# Patient Record
Sex: Female | Born: 1995 | Race: Black or African American | Hispanic: No | Marital: Single | Smoking: Current every day smoker
Health system: Southern US, Community
[De-identification: ages and names within clinical notes are randomized; demographics above are authoritative.]

## PROBLEM LIST (undated history)

## (undated) DIAGNOSIS — L309 Dermatitis, unspecified: Secondary | ICD-10-CM

## (undated) DIAGNOSIS — L509 Urticaria, unspecified: Secondary | ICD-10-CM

## (undated) DIAGNOSIS — J45909 Unspecified asthma, uncomplicated: Secondary | ICD-10-CM

## (undated) HISTORY — DX: Unspecified asthma, uncomplicated: J45.909

## (undated) HISTORY — DX: Urticaria, unspecified: L50.9

## (undated) HISTORY — DX: Dermatitis, unspecified: L30.9

---
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
## 2019-06-28 ENCOUNTER — Emergency Department (HOSPITAL_COMMUNITY)
Admission: EM | Admit: 2019-06-28 | Discharge: 2019-06-29 | Disposition: A | Discharge status: Home / Self Care | Payer: BC Managed Care – PPO | Attending: Emergency Medicine | Admitting: Emergency Medicine

## 2019-06-28 ENCOUNTER — Other Ambulatory Visit: Payer: Self-pay

## 2019-06-28 ENCOUNTER — Emergency Department (HOSPITAL_COMMUNITY): Payer: BC Managed Care – PPO

## 2019-06-28 ENCOUNTER — Encounter (HOSPITAL_COMMUNITY): Payer: Self-pay

## 2019-06-28 DIAGNOSIS — Z20828 Contact with and (suspected) exposure to other viral communicable diseases: Secondary | ICD-10-CM | POA: Insufficient documentation

## 2019-06-28 DIAGNOSIS — R0981 Nasal congestion: Secondary | ICD-10-CM | POA: Insufficient documentation

## 2019-06-28 DIAGNOSIS — M791 Myalgia, unspecified site: Secondary | ICD-10-CM | POA: Diagnosis not present

## 2019-06-28 DIAGNOSIS — R509 Fever, unspecified: Secondary | ICD-10-CM | POA: Diagnosis not present

## 2019-06-28 DIAGNOSIS — R05 Cough: Secondary | ICD-10-CM | POA: Insufficient documentation

## 2019-06-28 DIAGNOSIS — J029 Acute pharyngitis, unspecified: Secondary | ICD-10-CM | POA: Diagnosis not present

## 2019-06-28 DIAGNOSIS — R45851 Suicidal ideations: Secondary | ICD-10-CM | POA: Diagnosis not present

## 2019-06-28 DIAGNOSIS — Z046 Encounter for general psychiatric examination, requested by authority: Secondary | ICD-10-CM | POA: Diagnosis not present

## 2019-06-28 DIAGNOSIS — F17210 Nicotine dependence, cigarettes, uncomplicated: Secondary | ICD-10-CM | POA: Diagnosis not present

## 2019-06-28 DIAGNOSIS — R0982 Postnasal drip: Secondary | ICD-10-CM | POA: Diagnosis not present

## 2019-06-28 DIAGNOSIS — F329 Major depressive disorder, single episode, unspecified: Secondary | ICD-10-CM | POA: Insufficient documentation

## 2019-06-28 DIAGNOSIS — F32A Depression, unspecified: Secondary | ICD-10-CM

## 2019-06-28 LAB — COMPREHENSIVE METABOLIC PANEL
ALT: 14 U/L (ref 0–44)
AST: 17 U/L (ref 15–41)
Albumin: 3.7 g/dL (ref 3.5–5.0)
Alkaline Phosphatase: 54 U/L (ref 38–126)
Anion gap: 8 (ref 5–15)
BUN: 16 mg/dL (ref 6–20)
CO2: 21 mmol/L — ABNORMAL LOW (ref 22–32)
Calcium: 8.8 mg/dL — ABNORMAL LOW (ref 8.9–10.3)
Chloride: 107 mmol/L (ref 98–111)
Creatinine, Ser: 0.97 mg/dL (ref 0.44–1.00)
GFR calc Af Amer: >60 mL/min (ref >60)
GFR calc non Af Amer: >60 mL/min (ref >60)
Glucose, Bld: 140 mg/dL — ABNORMAL HIGH (ref 70–99)
Potassium: 4 mmol/L (ref 3.5–5.1)
Sodium: 136 mmol/L (ref 135–145)
Total Bilirubin: 0.5 mg/dL (ref 0.3–1.2)
Total Protein: 6.8 g/dL (ref 6.5–8.1)

## 2019-06-28 LAB — I-STAT BETA HCG BLOOD, ED (MC, WL, AP ONLY): I-stat hCG, quantitative: <5 m[IU]/mL (ref <5)

## 2019-06-28 LAB — RAPID URINE DRUG SCREEN, HOSP PERFORMED
Amphetamines: NOT DETECTED
Barbiturates: NOT DETECTED
Benzodiazepines: NOT DETECTED
Cocaine: NOT DETECTED
Opiates: NOT DETECTED
Tetrahydrocannabinol: POSITIVE — AB

## 2019-06-28 LAB — CBC
HCT: 40 % (ref 36.0–46.0)
Hemoglobin: 12.6 g/dL (ref 12.0–15.0)
MCH: 22.2 pg — ABNORMAL LOW (ref 26.0–34.0)
MCHC: 31.5 g/dL (ref 30.0–36.0)
MCV: 70.5 fL — ABNORMAL LOW (ref 80.0–100.0)
Platelets: 262 10*3/uL (ref 150–400)
RBC: 5.67 MIL/uL — ABNORMAL HIGH (ref 3.87–5.11)
RDW: 14 % (ref 11.5–15.5)
WBC: 6.7 10*3/uL (ref 4.0–10.5)
nRBC: 0 % (ref 0.0–0.2)

## 2019-06-28 LAB — SALICYLATE LEVEL: Salicylate Lvl: <7 mg/dL (ref 2.8–30.0)

## 2019-06-28 LAB — ACETAMINOPHEN LEVEL: Acetaminophen (Tylenol), Serum: <10 ug/mL — ABNORMAL LOW (ref 10–30)

## 2019-06-28 LAB — SARS CORONAVIRUS 2 BY RT PCR (HOSPITAL ORDER, PERFORMED IN ~~LOC~~ HOSPITAL LAB): SARS Coronavirus 2: NEGATIVE

## 2019-06-28 LAB — ETHANOL: Alcohol, Ethyl (B): <10 mg/dL (ref <10)

## 2019-06-28 NOTE — ED Notes (Signed)
Personal belongings inventoried and stored at locker #1 at [facility] , pt. wearing purple scrubs , sitter ordered .

## 2019-06-28 NOTE — ED Provider Notes (Signed)
[facility] Provider Note   CSN: [id number] Arrival date & time: 06/28/19  1942     History   Chief Complaint Chief Complaint  Patient presents with   Fever   Psychiatric Evaluation    HPI Brenda Alvarez is a 23 y.o. female presenting for evaluation of generalized body aches, tiredness, cough, and SI.  Patient states the past few days, she has been having worsening URI symptoms.  She states it began with nasal congestion and sinus pressure.  She then developed postnasal drip and sore throat.  She has been coughing, is nonproductive.  She reports feeling hot and cold, but no documented fevers.  She reports generalized body aches and feeling very tired.  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  She states that been having a lot of social issues, which is been causing her depression to be worse.  Her poor physical health recently has made her mental health even worse.  She reports she is having thoughts about wanting to herself, but does not have a plan.  She is not attempted to hurt herself recently.  She states that she used to be on medication for behavioral health, but is been many years.  She takes no medications currently.  She has no other medical problems.  She smokes 1 pack of cigarettes every 2 weeks, drinks about twice a week, and reports daily reduce.  No other drug use.  She denies chest pain, shortness of breath, nausea, vomiting, abdominal pain, urinary symptoms, abnormal bowel movements.     HPI  History reviewed. No pertinent past medical history.  There are no active problems to display for this patient.     OB History   No obstetric history on file.      Home Medications    Prior to Admission medications   Not on File    Family History History reviewed. No pertinent family history.  Social History Social History   Tobacco Use   Smoking status: Not on  file  Substance Use Topics   Alcohol use: Not on file   Drug use: Not on file     Allergies   Patient has no known allergies.   Review of Systems Review of Systems  Constitutional: Positive for fever (subjective).  HENT: Positive for congestion, postnasal drip, sinus pressure and sore throat.   Respiratory: Positive for cough.   Psychiatric/Behavioral: Positive for suicidal ideas.  All other systems reviewed and are negative.    Physical Exam Updated Vital Signs BP (!) 152/76 (BP Location: Right Arm)    Pulse 79    Temp 99.3 F (37.4 C) (Oral)    Resp 16    Ht 5\' 8"  (1.727 m)    Wt 106.6 kg    SpO2 99%    BMI 35.73 kg/m   Physical Exam Vitals signs and nursing note reviewed.  Constitutional:      General: She is not in acute distress.    Appearance: She is well-developed.     Comments: Appears nontoxic  HENT:     Head: Normocephalic and atraumatic.     Comments: OP without tonsillar swelling or erythema.  Uvula midline with equal palate rise.  Nasal congestion/rhinorrhea noted.    Nose: Mucosal edema and congestion present.  Eyes:     Extraocular Movements: Extraocular movements intact.     Conjunctiva/sclera: Conjunctivae normal.     Pupils: Pupils  are equal, round, and reactive to light.  Neck:     Musculoskeletal: Normal range of motion and neck supple.  Cardiovascular:     Rate and Rhythm: Normal rate and regular rhythm.     Pulses: Normal pulses.  Pulmonary:     Effort: Pulmonary effort is normal. No respiratory distress.     Breath sounds: Normal breath sounds. No wheezing.     Comments: Speaking in full sentences.  Clear lung sounds in all fields.  No signs of respiratory distress. Abdominal:     General: There is no distension.     Palpations: Abdomen is soft. There is no mass.     Tenderness: There is no abdominal tenderness. There is no guarding or rebound.  Musculoskeletal: Normal range of motion.  Skin:    General: Skin is warm and dry.    Neurological:     Mental Status: She is alert and oriented to person, place, and time.  Psychiatric:        Attention and Perception: She does not perceive auditory hallucinations.        Thought Content: Thought content includes suicidal ideation. Thought content does not include homicidal ideation. Thought content does not include homicidal or suicidal plan.     Comments: Very flat affect and withdrawn. Reports increasing depression and SI without plan. Denies HI or AVH      ED Treatments / Results  Labs (all labs ordered are listed, but only abnormal results are displayed) Labs Reviewed  COMPREHENSIVE METABOLIC PANEL - Abnormal; Notable for the following components:      Result Value   CO2 21 (*)    Glucose, Bld 140 (*)    Calcium 8.8 (*)    All other components within normal limits  ACETAMINOPHEN LEVEL - Abnormal; Notable for the following components:   Acetaminophen (Tylenol), Serum <10 (*)    All other components within normal limits  CBC - Abnormal; Notable for the following components:   RBC 5.67 (*)    MCV 70.5 (*)    MCH 22.2 (*)    All other components within normal limits  RAPID URINE DRUG SCREEN, HOSP PERFORMED - Abnormal; Notable for the following components:   Tetrahydrocannabinol POSITIVE (*)    All other components within normal limits  SARS CORONAVIRUS 2 (HOSPITAL ORDER, PERFORMED IN [facility])  ETHANOL  SALICYLATE LEVEL  I-STAT BETA HCG BLOOD, ED ([facility], [facility], [facility] ONLY)    EKG None  Radiology Dg Chest Portable 1 View  Result Date: 06/28/2019 CLINICAL DATA:  Cough. EXAM: PORTABLE CHEST 1 VIEW COMPARISON:  None. FINDINGS: The heart size and mediastinal contours are within normal limits. Both lungs are clear. The visualized skeletal structures are unremarkable. IMPRESSION: No active disease. Electronically Signed   By: Katherine Mantlehristopher  Green M.D.   On: 06/28/2019 21:07    Procedures Procedures (including critical care time)  Medications Ordered  in ED Medications - No data to display   Initial Impression / Assessment and Plan / ED Course  I have reviewed the triage vital signs and the nursing notes.  Pertinent labs & imaging results that were available during my care of the patient were reviewed by me and considered in my medical decision making (see chart for details).        Patient presenting for evaluation of viral-like illness and SI.  Physical exam shows patient is afebrile and appears nontoxic.  However, she may have had a coronavirus exposure, and her symptoms could be due  to this.  Also consider other viral illnesses worse sinusitis.  Will obtain labs, chest x-ray, and covid swab.  Patient also reporting SI without plan, although worsening depression.  Will have patient evaluated by tts.  X-ray viewed interpreted by me, no pneumonia, pneumothorax, effusion, cardiomegaly.  Labs overall reassuring.  COVID pending.  Covid negative.  As such, symptoms are likely due to viral illness, and I recommend symptomatic treatment. Pt is medically cleared for tts eval.   Lenetta QuakerJasmine M Finchum was evaluated in Emergency Department on 06/28/2019 for the symptoms described in the history of present illness. She was evaluated in the context of the global COVID-19 pandemic, which necessitated consideration that the patient might be at risk for infection with the SARS-CoV-2 virus that causes COVID-19. Institutional protocols and algorithms that pertain to the evaluation of patients at risk for COVID-19 are in a state of rapid change based on information released by regulatory bodies including the CDC and federal and state organizations. These policies and algorithms were followed during the patient's care in the ED.   Final Clinical Impressions(s) / ED Diagnoses   Final diagnoses:  None    ED Discharge Orders    None       Alveria ApleyCaccavale, Bellami Farrelly, PA-C 06/29/19 0030    Tilden Fossaees, Elizabeth, MD 06/29/19 1155

## 2019-06-28 NOTE — ED Triage Notes (Signed)
Pt arrives POV for eval of known covid +exposure, subjective fever, fatigue and general malaise. During triage pt also reports passive SI w/ no plan, however does state she no longer wishes to live. Pt is calm, cooperative during triage.

## 2019-06-29 DIAGNOSIS — F329 Major depressive disorder, single episode, unspecified: Secondary | ICD-10-CM | POA: Diagnosis present
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
## 2019-06-29 NOTE — ED Notes (Signed)
Pt upset this morning cursing wanting to go home not understanding why she is here , explained why she is here and the next steps for her she verbalized understanding of plan , sitter remains at beside

## 2019-06-29 NOTE — ED Notes (Signed)
Pt walking out. Pt states she is leaving. This RN encouraged patient to stay.

## 2019-06-29 NOTE — ED Notes (Signed)
Patient slammed the door after nurse explained to her that she can not leave / explained the plan of care ( reevaluate in am ) .

## 2019-06-29 NOTE — ED Notes (Signed)
TTS video interview in progress .  

## 2019-06-29 NOTE — Discharge Instructions (Signed)
Follow-up as recommended by behavioral health.  Return to ER for new or worsening symptoms.

## 2019-06-29 NOTE — ED Notes (Signed)
Pt slamming door. RN to the bedside.

## 2019-06-29 NOTE — ED Notes (Signed)
TTS video equipment set up at bedside for counselor interview.

## 2019-06-29 NOTE — ED Notes (Signed)
Nurse explained plan of care to pt.

## 2019-06-29 NOTE — ED Provider Notes (Signed)
23 year old female patient seen in behavioral health rounds today.  Patient is alert, ambulatory without any assistance, denies any complaints or concerns at this time.  Patient was seen by behavioral health who recommends follow-up with her outpatient provider.  Recommend patient return to ER for new or worsening symptoms.   Tacy Learn, PA-C 06/29/19 1423    Charlesetta Shanks, MD 07/04/19 [phone]

## 2019-06-29 NOTE — ED Notes (Signed)
Pt is resting calmly and being cooperative. Pt aware that we are waiting for dispo. Pt also made aware that mother has called she inform myself that she would not like me to talk to her.

## 2019-06-29 NOTE — ED Notes (Signed)
Counsellor advised RN that pt.wiill stay this evening and will be reevaluated in the morning.

## 2019-06-29 NOTE — ED Notes (Signed)
[facility] called to inquire about note for dispo- Did speak to the NP and was told they were working on this now with a plan to discharge. Will inform EDP once note is in.

## 2019-06-29 NOTE — BH Assessment (Addendum)
Tele Assessment Note   Patient Name: Brenda Alvarez MRN: [id number] Referring Physician: Dr. Tilden FossaElizabeth Rees, MD [facility] Location of Provider: Behavioral Health TTS Department  Leavy CellaJasmine Kaleen OdeaM Yankey is a 23 y.o. female [facility] to experiencing symptoms that she is attributing possible COVID 19 exposure. Pt shares she works in a job where she has constant interactions with people (two different [employer] stations) and that she's not allowed to wear a mask at either one, so when she began feeling ill she was not surprised to learn that one of her co-workers had become ill. Pt states that, on top of these symptoms, she has also been experiencing SI. Pt shares this SI is passive and that she has no plan. Pt states she has a hx of "may" attempts with the most recent 2 months ago. [facility].  Pt denies HI, engagement in the legal system, or access to weapons/guns. Pt deferred answering the question about NSSIB. She shared she sometimes sees "shadows" or hears "things." she shares she smokes marijuana approximately 1x/week.  Pt declined having anyone clinician could talk to for collateral.   Pt is oriented x4. Her recent and remote memory is intact. Pt was cooperative, though cagey at times, during the assessment process. Pt's insight, judgement, and impulse control is impaired at this time.   Diagnosis: F33.2, Major depressive disorder, Recurrent episode, Severe   Past Medical History: History reviewed. No pertinent past medical history.   Family History: History reviewed. No pertinent family history.  Social History:  has no history on file for tobacco, alcohol, and drug.  Additional Social History:  Alcohol / Drug Use Pain Medications: Please see MAR Prescriptions: Please see MAR Over the Counter: Please see MAR History of alcohol / drug use?: Yes Longest period of sobriety (when/how long): Unknown Substance #1 Name of Substance 1:  Marijuana 1 - Age of First Use: 15 1 - Amount (size/oz): 1/4 gram 1 - Frequency: 1x/week 1 - Duration: Unknown 1 - Last Use / Amount: 06/27/2019  CIWA: CIWA-Ar BP: 122/74 Pulse Rate: 62 COWS:    Allergies: No Known Allergies  Home Medications: (Not in a hospital admission)   OB/GYN Status:  No LMP recorded.  General Assessment Data [facility] Assessment: In system Is this a Tele or Face-to-Face Assessment?: Tele Assessment Is this an Initial Assessment or a Re-assessment for this encounter?: Initial Assessment Patient Accompanied by:: N/A Language Other than English: No Living Arrangements: Other (Comment)(Pt is currently living with her father) What gender do you identify as?: Female Marital status: Single Maiden name: Reola CalkinsBeck Pregnancy Status: No Living Arrangements: Parent Can pt return to current living arrangement?: Yes Admission Status: Voluntary Is patient capable of signing voluntary admission?: Yes Referral Source: Self/Family/Friend Insurance type: BCBS     Crisis Care Plan Living Arrangements: Parent Legal Guardian: Other:(Self) Name of Psychiatrist: None Name of Therapist: None  Education Status Is patient currently in school?: No Is the patient employed, unemployed or receiving disability?: Employed  Risk to self with the past 6 months Suicidal Ideation: Yes-Currently Present Has patient been a risk to self within the past 6 months prior to admission? : No Suicidal Intent: No Has patient had any suicidal intent within the past 6 months prior to admission? : No Is patient at risk for suicide?: Yes Suicidal Plan?: No Has patient had any suicidal plan within the past 6 months prior to admission? :  No Access to Means: No What has been your use of drugs/alcohol within the last 12 months?: Pt acknowledges marijuana use Previous Attempts/Gestures: Yes("Many") How many times?: ("Many") Other Self Harm Risks: None noted Triggers  for Past Attempts: Unpredictable, Unknown Intentional Self Injurious Behavior: (Pt deferred this answer) Family Suicide History: Unknown Recent stressful life event(s): Conflict (Comment), Loss (Comment)(Broke up w/ partner, moved multiple x, conflict w/ others) Persecutory voices/beliefs?: No Depression: Yes Depression Symptoms: Insomnia, Isolating, Guilt, Feeling worthless/self pity, Loss of interest in usual pleasures Substance abuse history and/or treatment for substance abuse?: No Suicide prevention information given to non-admitted patients: Not applicable  Risk to Others within the past 6 months Homicidal Ideation: No Does patient have any lifetime risk of violence toward others beyond the six months prior to admission? : No Thoughts of Harm to Others: No Current Homicidal Intent: No Current Homicidal Plan: No Access to Homicidal Means: No Identified Victim: None noted History of harm to others?: No Assessment of Violence: On admission Violent Behavior Description: None noted Does patient have access to weapons?: (Pt denied access to guns/weapons) Criminal Charges Pending?: No Does patient have a court date: No Is patient on probation?: No  Psychosis Hallucinations: Visual, Auditory Delusions: None noted  Mental Status Report Appearance/Hygiene: In scrubs Eye Contact: Good Motor Activity: Unremarkable Speech: Logical/coherent Level of Consciousness: Alert Mood: Depressed, Anxious Affect: Appropriate to circumstance Anxiety Level: Minimal Thought Processes: Circumstantial Judgement: Impaired Orientation: Person, Place, Time, Situation Obsessive Compulsive Thoughts/Behaviors: None  Cognitive Functioning Concentration: Normal Memory: Recent Intact, Remote Intact Is patient IDD: No Insight: Fair Impulse Control: Poor Appetite: Fair Have you had any weight changes? : No Change Sleep: No Change Total Hours of Sleep: 5 Vegetative Symptoms: None  ADLScreening  [facility] Assessment Services) Patient's cognitive ability adequate to safely complete daily activities?: Yes Patient able to express need for assistance with ADLs?: Yes Independently performs ADLs?: Yes (appropriate for developmental age)  Prior Inpatient Therapy Prior Inpatient Therapy: Yes Prior Therapy Dates: 2009 Prior Therapy Facilty/Provider(s): [facility] Reason for Treatment: SI  Prior Outpatient Therapy Prior Outpatient Therapy: No Does patient have an ACCT team?: No Does patient have Intensive In-House Services?  : No Does patient have [facility] services? : No Does patient have P4CC services?: No  ADL Screening (condition at time of admission) Patient's cognitive ability adequate to safely complete daily activities?: Yes Is the patient deaf or have difficulty hearing?: No Does the patient have difficulty seeing, even when wearing glasses/contacts?: No Does the patient have difficulty concentrating, remembering, or making decisions?: No Patient able to express need for assistance with ADLs?: Yes Does the patient have difficulty dressing or bathing?: No Independently performs ADLs?: Yes (appropriate for developmental age) Does the patient have difficulty walking or climbing stairs?: No Weakness of Legs: None Weakness of Arms/Hands: None  Home Assistive Devices/Equipment Home Assistive Devices/Equipment: None  Therapy Consults (therapy consults require a physician order) PT Evaluation Needed: No OT Evalulation Needed: No SLP Evaluation Needed: No Abuse/Neglect Assessment (Assessment to be complete while patient is alone) Abuse/Neglect Assessment Can Be Completed: Unable to assess, patient is non-responsive or altered mental status Values / Beliefs Cultural Requests During Hospitalization: None Spiritual Requests During Hospitalization: None Consults Spiritual Care Consult Needed: No Social Work Consult Needed: No Regulatory affairs officer (For Healthcare) Does Patient Have  a Medical Advance Directive?: No Would patient like information on creating a medical advance directive?: No - Patient declined        Disposition: Lindon Romp, NP, reviewed  pt's chart and information and determined pt should be observed overnight for safety and stabilization. Pt will be re-assessed in the morning (06/29/2019). This information was provided to pt's nurse, Lucious Grovesobert RN, at [phone].   Disposition Initial Assessment Completed for this Encounter: Yes Patient referred to: Other (Comment)(Pt will be observed overnight for safety and stability)  This service was provided via telemedicine using a 2-way, interactive audio and video technology.  Names of all persons participating in this telemedicine service and their role in this encounter. Name: Leavy CellaJasmine 'Ember' Arrey Role: Patient  Name: Nira ConnJason Berry Role: Nurse Practitioner  Name: Duard BradySamantha Elliett Guarisco Role: Candise Cheliician    Armeda Plumb L Hala Narula 06/29/2019 3:09 AM

## 2019-08-02 DIAGNOSIS — M654 Radial styloid tenosynovitis [de Quervain]: Secondary | ICD-10-CM | POA: Diagnosis not present

## 2019-08-26 DIAGNOSIS — M654 Radial styloid tenosynovitis [de Quervain]: Secondary | ICD-10-CM | POA: Diagnosis not present

## 2019-10-12 DIAGNOSIS — Z20828 Contact with and (suspected) exposure to other viral communicable diseases: Secondary | ICD-10-CM | POA: Diagnosis not present

## 2019-10-12 DIAGNOSIS — R509 Fever, unspecified: Secondary | ICD-10-CM | POA: Diagnosis not present

## 2020-03-15 DIAGNOSIS — Z20822 Contact with and (suspected) exposure to covid-19: Secondary | ICD-10-CM | POA: Diagnosis not present

## 2020-03-15 DIAGNOSIS — R509 Fever, unspecified: Secondary | ICD-10-CM | POA: Diagnosis not present

## 2020-03-15 DIAGNOSIS — J029 Acute pharyngitis, unspecified: Secondary | ICD-10-CM | POA: Diagnosis not present

## 2020-03-15 DIAGNOSIS — M791 Myalgia, unspecified site: Secondary | ICD-10-CM | POA: Diagnosis not present

## 2020-12-04 DIAGNOSIS — Z113 Encounter for screening for infections with a predominantly sexual mode of transmission: Secondary | ICD-10-CM | POA: Diagnosis not present

## 2020-12-04 DIAGNOSIS — Z114 Encounter for screening for human immunodeficiency virus [HIV]: Secondary | ICD-10-CM | POA: Diagnosis not present

## 2020-12-06 ENCOUNTER — Other Ambulatory Visit: Payer: Self-pay

## 2020-12-06 ENCOUNTER — Emergency Department (HOSPITAL_COMMUNITY): Payer: BC Managed Care – PPO

## 2020-12-06 ENCOUNTER — Emergency Department (HOSPITAL_COMMUNITY)
Admission: EM | Admit: 2020-12-06 | Discharge: 2020-12-07 | Disposition: A | Discharge status: Home / Self Care | Payer: BC Managed Care – PPO | Attending: Emergency Medicine | Admitting: Emergency Medicine

## 2020-12-06 DIAGNOSIS — X58XXXA Exposure to other specified factors, initial encounter: Secondary | ICD-10-CM | POA: Insufficient documentation

## 2020-12-06 DIAGNOSIS — T189XXA Foreign body of alimentary tract, part unspecified, initial encounter: Secondary | ICD-10-CM | POA: Diagnosis not present

## 2020-12-06 MED ORDER — LIDOCAINE VISCOUS HCL 2 % MT SOLN
15.0000 mL | Freq: Once | OROMUCOSAL | Status: AC
  Administered 2020-12-07: 15 mL via ORAL
  Filled 2020-12-06: qty 15

## 2020-12-06 MED ORDER — ALUM & MAG HYDROXIDE-SIMETH 200-200-20 MG/5ML PO SUSP
30.0000 mL | Freq: Once | ORAL | Status: AC
  Administered 2020-12-07: 30 mL via ORAL
  Filled 2020-12-06: qty 30

## 2020-12-06 NOTE — ED Triage Notes (Signed)
Patient reports she was drinking at a restaurant and swallowed glass. Patient reports she is unsure what size the glass was but her throat hurts on the left side and she tastes blood when she coughs and her voice is hoarse.

## 2020-12-06 NOTE — ED Provider Notes (Signed)
[facility]-EMERGENCY DEPT Provider Note   CSN: [id number] Arrival date & time: 12/06/20  2212     History Chief Complaint  Patient presents with   Swallowed Foreign Body    Brenda Alvarez is a 24 y.o. female with a history of depression who presents the emergency department with a chief complaint of foreign body ingestion.  The patient reports that she was drinking a margarita with salt on the rim just prior to arrival.  After taking approximately 3 sips of the drink, she felt a solid substance on her tongue.  She initially thought it was a piece of salt from the rim, but when she stuck out her tongue the object was concerning for a piece of glass.  She did not notice any abnormalities to the glass that she was drinking out of.  Following the episode, she did started to develop a left-sided sore throat that she characterizes as sharp and nonradiating.  She states that she felt that her voice was more hoarse and she had a cough where she noticed a small amount of bright red blood in the cough.  She states that prior to drinking a margarita that she was asymptomatic.   She denies any clots in the cough.  She did not have a choking episode while turning the [employer].  She has had no nausea, vomiting, hematemesis, abdominal pain, back pain, drooling, trismus, fever, chills, URI symptoms, rectal bleeding, melena, or hematochezia.  She is concerned that she may have swallowed some glass in the first couple of sips of her drink given that she had sudden onset of symptoms after drinking the beverage.  No history of similar.  No treatment prior to arrival.  No known aggravating or alleviating factors.  The history is provided by the patient and medical records. No language interpreter was used.       No past medical history on file.  Patient Active Problem List   Diagnosis Date Noted   MDD (major depressive disorder) 06/29/2019    OB History   No obstetric history  on file.     No family history on file.     Home Medications Prior to Admission medications   Medication Sig Start Date End Date Taking? Authorizing Provider  lidocaine (XYLOCAINE) 2 % solution Use as directed 15 mLs in the mouth or throat every 3 (three) hours as needed for mouth pain. 12/07/20   Ondrea Dow A, PA-C    Allergies    Celery oil, Duricef [cefadroxil], Keflex [cephalexin], Lactose intolerance (gi), and Pork-derived products  Review of Systems   Review of Systems  Constitutional: Negative for activity change, chills and fever.  HENT: Positive for sore throat. Negative for congestion, sinus pressure, sinus pain, trouble swallowing and voice change.   Respiratory: Positive for cough. Negative for choking, shortness of breath and wheezing.   Cardiovascular: Negative for chest pain, palpitations and leg swelling.  Gastrointestinal: Negative for abdominal pain, diarrhea, nausea, rectal pain and vomiting.  Genitourinary: Negative for dysuria.  Musculoskeletal: Negative for back pain.  Skin: Negative for rash.  Allergic/Immunologic: Negative for immunocompromised state.  Neurological: Negative for headaches.  Psychiatric/Behavioral: Negative for confusion.    Physical Exam Updated Vital Signs BP 125/75    Pulse 64    Temp 98.6 F (37 C)    Resp 17    LMP 11/27/2020 Comment: neg preg test   SpO2 99%   Physical Exam Vitals and nursing note reviewed.  Constitutional:  General: She is not in acute distress.    Comments: Well-appearing.  No acute distress.  HENT:     Head: Normocephalic.     Mouth/Throat:     Mouth: Mucous membranes are moist.     Pharynx: No oropharyngeal exudate or posterior oropharyngeal erythema.     Comments: No wounds noted to the dorsum of the tongue.  Posterior oropharynx is unremarkable.  Tolerating secretions without difficulty.  Phonation is normal. Eyes:     Conjunctiva/sclera: Conjunctivae normal.  Cardiovascular:     Rate and  Rhythm: Normal rate and regular rhythm.     Pulses: Normal pulses.     Heart sounds: No murmur heard. No friction rub. No gallop.   Pulmonary:     Effort: Pulmonary effort is normal. No respiratory distress.     Breath sounds: No stridor. No wheezing, rhonchi or rales.  Chest:     Chest wall: No tenderness.  Abdominal:     General: There is no distension.     Palpations: Abdomen is soft. There is no mass.     Tenderness: There is no abdominal tenderness. There is no right CVA tenderness, left CVA tenderness, guarding or rebound.     Hernia: No hernia is present.     Comments: Abdomen is soft, nontender, nondistended.  Musculoskeletal:     Cervical back: Neck supple.  Skin:    General: Skin is warm.     Findings: No rash.  Neurological:     Mental Status: She is alert.  Psychiatric:        Behavior: Behavior normal.     ED Results / Procedures / Treatments   Labs (all labs ordered are listed, but only abnormal results are displayed) Labs Reviewed  POC URINE PREG, ED    EKG None  Radiology DG Chest 2 View  Result Date: 12/06/2020 CLINICAL DATA:  Swallowed piece of glass EXAM: CHEST - 2 VIEW COMPARISON:  06/28/2019 FINDINGS: Heart and mediastinal contours are within normal limits. No focal opacities or effusions. No acute bony abnormality. No radiopaque foreign body. IMPRESSION: Negative. Electronically Signed   By: Charlett Nose M.D.   On: 12/06/2020 23:47   DG Abdomen 1 View  Result Date: 12/07/2020 CLINICAL DATA:  Foreign body ingestion EXAM: ABDOMEN - 1 VIEW COMPARISON:  None. FINDINGS: Normal abdominal gas pattern. No gross free intraperitoneal gas. No retained radiopaque foreign body within the abdomen. No organomegaly. No abnormal calcifications within the abdomen. No acute bone abnormality. IMPRESSION: Negative.  No retained radiopaque foreign body within the abdomen. Electronically Signed   By: Helyn Numbers MD   On: 12/07/2020 02:17    Procedures Procedures  (including critical care time)  Medications Ordered in ED Medications  alum & mag hydroxide-simeth (MAALOX/MYLANTA) 200-200-20 MG/5ML suspension 30 mL (30 mLs Oral Given 12/07/20 0051)    And  lidocaine (XYLOCAINE) 2 % viscous mouth solution 15 mL (15 mLs Oral Given 12/07/20 0051)    ED Course  I have reviewed the triage vital signs and the nursing notes.  Pertinent labs & imaging results that were available during my care of the patient were reviewed by me and considered in my medical decision making (see chart for details).    MDM Rules/Calculators/A&P                          24 year old female with a history of depression who presents to the emergency department with concern for foreign body ingestion.  She is concerned that she swallowed a small piece of glass while consuming a margarita with a salt run just prior to arrival.  She states that she did notice one small piece of glass on her tongue that she spit out, but notably did not have any wounds or lacerations from the tongue from the episode.  She is concerned that on the first 3 drinks of her beverage that she may have consumed a piece of glass as she began having a sore throat, hoarse voice, and had a brief episode of coughing where she noticed a small amount of bright red blood.   Vital signs are stable.  The patient was discussed with Dr. Elesa Massed, attending physician.  Posterior oropharynx is unremarkable.  Dorsum of the tongue is unremarkable.  I do not see any wounds present on the oropharynx.  Abdomen is benign.  Imaging has been independently reviewed and interpreted by me.  X-ray of the chest and abdomen were obtained.  No evidence of foreign body.  No evidence of perforation of the abdomen.  No acute findings.  She was given a a GI cocktail in the ER and reports significant improvement in her sore throat.  Could consider a viral pharyngitis or esophageal irritation secondary to the beverage that she was consuming.  Have a  low suspicion for esophageal impaction or abdominal perforation.  Recommended close observation for her symptoms.  She will be discharged with viscous lidocaine to help with her sore throat.  She was advised that she should return to the emergency department if she develops sudden onset CeraVe or abdominal pain, rectal bleeding, melena, hematochezia, hematemesis, as well as other strict return precautions.  She is hemodynamically stable and in no acute distress.  Safe discharge home with outpatient follow-up as needed.    Final Clinical Impression(s) / ED Diagnoses Final diagnoses:  Swallowed foreign body, initial encounter    Rx / DC Orders ED Discharge Orders         Ordered    lidocaine (XYLOCAINE) 2 % solution  Every  3 hours PRN        12/07/20 0248           Frederik Pear A, PA-C 12/07/20 0336    Ward, Layla Maw, DO 12/07/20 [phone]

## 2020-12-07 ENCOUNTER — Emergency Department (HOSPITAL_COMMUNITY): Payer: BC Managed Care – PPO

## 2020-12-07 DIAGNOSIS — T189XXA Foreign body of alimentary tract, part unspecified, initial encounter: Secondary | ICD-10-CM | POA: Diagnosis not present

## 2020-12-07 LAB — POC URINE PREG, ED: Preg Test, Ur: NEGATIVE

## 2020-12-07 MED ORDER — LIDOCAINE VISCOUS HCL 2 % MT SOLN: 15.0000 mL | OROMUCOSAL | 0 refills | Status: AC | PRN

## 2020-12-07 NOTE — Discharge Instructions (Signed)
Thank you for allowing me to care for you today in the Emergency Department.   Your work-up today was reassuring.  You will likely passed the small piece of glass over the next 24 to 48 hours in your stool without difficulty.  You can swallow 15 mL of viscous lidocaine once every 3 hours as needed for sore throat.  However, if you start having severe, uncontrollable abdominal pain, bloody or black stool, uncontrollable vomiting, vomiting blood, or other new concerning symptoms, you should return to the emergency department for re-evaluation.

## 2021-01-03 ENCOUNTER — Other Ambulatory Visit: Payer: Self-pay

## 2021-01-03 ENCOUNTER — Encounter (HOSPITAL_COMMUNITY): Payer: Self-pay

## 2021-01-03 ENCOUNTER — Emergency Department (HOSPITAL_COMMUNITY)
Admission: EM | Admit: 2021-01-03 | Discharge: 2021-01-03 | Disposition: A | Discharge status: Home / Self Care | Payer: BC Managed Care – PPO | Attending: Emergency Medicine | Admitting: Emergency Medicine

## 2021-01-03 DIAGNOSIS — R220 Localized swelling, mass and lump, head: Secondary | ICD-10-CM | POA: Diagnosis not present

## 2021-01-03 DIAGNOSIS — M795 Residual foreign body in soft tissue: Secondary | ICD-10-CM

## 2021-01-03 DIAGNOSIS — S0085XA Superficial foreign body of other part of head, initial encounter: Secondary | ICD-10-CM | POA: Diagnosis not present

## 2021-01-03 MED ORDER — CLINDAMYCIN HCL 150 MG PO CAPS: 450.0000 mg | ORAL_CAPSULE | Freq: Three times a day (TID) | ORAL | 0 refills | Status: AC

## 2021-01-03 MED ORDER — LIDOCAINE-EPINEPHRINE (PF) 2 %-1:200000 IJ SOLN
10.0000 mL | Freq: Once | INTRAMUSCULAR | Status: DC
  Filled 2021-01-03: qty 20

## 2021-01-03 NOTE — ED Provider Notes (Signed)
[facility]-EMERGENCY DEPT Provider Note   CSN: [id number] Arrival date & time: 01/03/21  0932     History Chief Complaint  Patient presents with  . Facial Swelling    Brenda Alvarez is a 25 y.o. female with no medical history presents the ED with complaints of lip swelling in context of recent lip piercing.  Patient reports that 3 days ago she had multiple piercings to her lower lip.  She states that the ball on one of her piercings is now lodged inside of her lip and she cannot remove it.  Her lip is now swollen considerably.  She denies any fevers, difficulty eating or drinking, difficulty swallowing, shortness of breath, or other symptoms.  She has been taking 800 mg ibuprofen every 6 hours.  No significant improvement.  HPI     History reviewed. No pertinent past medical history.  Patient Active Problem List   Diagnosis Date Noted  . MDD (major depressive disorder) 06/29/2019    History reviewed. No pertinent surgical history.   OB History   No obstetric history on file.     History reviewed. No pertinent family history.     Home Medications Prior to Admission medications   Medication Sig Start Date End Date Taking? Authorizing Provider  clindamycin (CLEOCIN) 150 MG capsule Take 3 capsules (450 mg total) by mouth 3 (three) times daily for 5 days. 01/03/21 01/08/21 Yes Lorelee New, PA-C  lidocaine (XYLOCAINE) 2 % solution Use as directed 15 mLs in the mouth or throat every 3 (three) hours as needed for mouth pain. 12/07/20   McDonald, Mia A, PA-C    Allergies    Celery oil, Duricef [cefadroxil], Keflex [cephalexin], Lactose intolerance (gi), and Pork-derived products  Review of Systems   Review of Systems  All other systems reviewed and are negative.   Physical Exam Updated Vital Signs BP (!) 138/93 (BP Location: Left Arm)   Pulse 74   Temp 98.1 F (36.7 C) (Oral)   Resp 17   LMP 12/20/2020   SpO2 100%   Physical Exam Vitals  and nursing note reviewed. Exam conducted with a chaperone present.  Constitutional:      Appearance: Normal appearance.  HENT:     Head: Normocephalic and atraumatic.     Mouth/Throat:     Comments: Multiple piercings involving her lower lip.  Medial piercing with ball lodged in lower lip.  Can still be partially visualized (see photo).  There is also considerable swelling involving her lower lip on the affected side.  No trismus.  Patent oropharynx.  No drooling. Eyes:     General: No scleral icterus.    Conjunctiva/sclera: Conjunctivae normal.  Pulmonary:     Effort: Pulmonary effort is normal.  Skin:    General: Skin is dry.  Neurological:     Mental Status: She is alert.     GCS: GCS eye subscore is 4. GCS verbal subscore is 5. GCS motor subscore is 6.  Psychiatric:        Mood and Affect: Mood normal.        Behavior: Behavior normal.        Thought Content: Thought content normal.          ED Results / Procedures / Treatments   Labs (all labs ordered are listed, but only abnormal results are displayed) Labs Reviewed - No data to display  EKG None  Radiology No results found.  Procedures .Foreign Body Removal  Date/Time: 01/03/2021  3:39 PM Performed by: Lorelee New, PA-C Authorized by: Lorelee New, PA-C  Consent: Verbal consent obtained. Consent given by: patient Patient understanding: patient states understanding of the procedure being performed Patient consent: the patient's understanding of the procedure matches consent given Procedure consent: procedure consent matches procedure scheduled Relevant documents: relevant documents present and verified Patient identity confirmed: verbally with patient Body area: skin General location: head/neck Location details: mouth Anesthesia: local infiltration  Anesthesia: Local Anesthetic: lidocaine 1% with epinephrine Patient restrained: no Patient cooperative: yes Removal mechanism:  forceps Complexity: simple Post-procedure assessment: foreign body removed Comments: Removed piercing from lower lip.  Encouraged her to leave it out.  She denies any issues with other piercing and would prefer that it is not removed at this time.  I feel as though that is reasonable.     (including critical care time)  Medications Ordered in ED Medications  lidocaine-EPINEPHrine (XYLOCAINE W/EPI) 2 %-1:200000 (PF) injection 10 mL (has no administration in time range)    ED Course  I have reviewed the triage vital signs and the nursing notes.  Pertinent labs & imaging results that were available during my care of the patient were reviewed by me and considered in my medical decision making (see chart for details).    MDM Rules/Calculators/A&P                          Patient with multiple piercings involving her lower lip.  The one that was recently placed (medially) is now lodged in her lower lip which has caused considerable swelling and discomfort.  The area was anesthetized and the ball was unscrewed allowing for the entire piercing to be removed (see picture #2).    I instructed patient to leave the piercing out.  I also offered to remove her other piercing to mitigate risk of complication, but patient declined as she would prefer to keep it in place.  She denies any issues with that piercing.  I feel as though her swelling should subside now that the lodged piercing is removed.  Encouraged her to continue with ibuprofen 6 mg every 6 hours as needed for swelling and discomfort.  I have also prescribed her clindamycin given that she has an allergy to PCN/cephalosporins.  Patient to follow-up with her primary care provider for ongoing evaluation and management.  ED return precautions discussed.  Patient voices understanding and is agreeable to the plan.  Final Clinical Impression(s) / ED Diagnoses Final diagnoses:  Foreign body (FB) in soft tissue    Rx / DC Orders ED Discharge  Orders         Ordered    clindamycin (CLEOCIN) 150 MG capsule  3 times daily        01/03/21 1534           Elvera Maria 01/03/21 1552    Milagros Loll, MD 01/05/21 1140

## 2021-01-03 NOTE — ED Triage Notes (Signed)
Pt arrived via walk in, lip pierced x3 days ago, swelling at area since, woke this morning. Swollen over piercing in lip.

## 2021-01-03 NOTE — Discharge Instructions (Addendum)
The piercing that was lodged in your lower lip has been successfully removed.  Please continue to take ibuprofen 600 mg every 6 hours as needed for swelling and discomfort.  I have also prescribed you clindamycin which I would like you to take to help mitigate risk of infection.  Your lip is numb and will likely remain that way for another hour or two.  Please exercise caution eating and drinking.  Follow-up with your primary care provider for ongoing evaluation and management.  If you do not have one, please get established with one as soon as possible.  Return to the ED or seek immediate medical attention should you experience any new or worsening symptoms.

## 2021-01-16 DIAGNOSIS — F3132 Bipolar disorder, current episode depressed, moderate: Secondary | ICD-10-CM | POA: Diagnosis not present

## 2021-01-16 DIAGNOSIS — F4310 Post-traumatic stress disorder, unspecified: Secondary | ICD-10-CM | POA: Diagnosis not present

## 2021-01-22 DIAGNOSIS — F4310 Post-traumatic stress disorder, unspecified: Secondary | ICD-10-CM | POA: Diagnosis not present

## 2021-01-22 DIAGNOSIS — F3132 Bipolar disorder, current episode depressed, moderate: Secondary | ICD-10-CM | POA: Diagnosis not present

## 2021-01-22 DIAGNOSIS — F209 Schizophrenia, unspecified: Secondary | ICD-10-CM | POA: Diagnosis not present

## 2021-01-30 DIAGNOSIS — F209 Schizophrenia, unspecified: Secondary | ICD-10-CM | POA: Diagnosis not present

## 2021-01-30 DIAGNOSIS — F3132 Bipolar disorder, current episode depressed, moderate: Secondary | ICD-10-CM | POA: Diagnosis not present

## 2021-01-30 DIAGNOSIS — F4310 Post-traumatic stress disorder, unspecified: Secondary | ICD-10-CM | POA: Diagnosis not present

## 2021-02-07 DIAGNOSIS — F3132 Bipolar disorder, current episode depressed, moderate: Secondary | ICD-10-CM | POA: Diagnosis not present

## 2021-02-07 DIAGNOSIS — F4310 Post-traumatic stress disorder, unspecified: Secondary | ICD-10-CM | POA: Diagnosis not present

## 2021-02-07 DIAGNOSIS — F209 Schizophrenia, unspecified: Secondary | ICD-10-CM | POA: Diagnosis not present

## 2021-02-14 DIAGNOSIS — F3132 Bipolar disorder, current episode depressed, moderate: Secondary | ICD-10-CM | POA: Diagnosis not present

## 2021-02-14 DIAGNOSIS — F4310 Post-traumatic stress disorder, unspecified: Secondary | ICD-10-CM | POA: Diagnosis not present

## 2021-02-14 DIAGNOSIS — F209 Schizophrenia, unspecified: Secondary | ICD-10-CM | POA: Diagnosis not present

## 2021-02-21 DIAGNOSIS — F4310 Post-traumatic stress disorder, unspecified: Secondary | ICD-10-CM | POA: Diagnosis not present

## 2021-02-21 DIAGNOSIS — F209 Schizophrenia, unspecified: Secondary | ICD-10-CM | POA: Diagnosis not present

## 2021-02-21 DIAGNOSIS — F3132 Bipolar disorder, current episode depressed, moderate: Secondary | ICD-10-CM | POA: Diagnosis not present

## 2021-03-01 DIAGNOSIS — F3132 Bipolar disorder, current episode depressed, moderate: Secondary | ICD-10-CM | POA: Diagnosis not present

## 2021-03-01 DIAGNOSIS — F4310 Post-traumatic stress disorder, unspecified: Secondary | ICD-10-CM | POA: Diagnosis not present

## 2021-03-01 DIAGNOSIS — F902 Attention-deficit hyperactivity disorder, combined type: Secondary | ICD-10-CM | POA: Diagnosis not present

## 2021-03-02 DIAGNOSIS — F314 Bipolar disorder, current episode depressed, severe, without psychotic features: Secondary | ICD-10-CM | POA: Diagnosis not present

## 2021-03-02 DIAGNOSIS — E1165 Type 2 diabetes mellitus with hyperglycemia: Secondary | ICD-10-CM | POA: Diagnosis not present

## 2021-03-02 DIAGNOSIS — L309 Dermatitis, unspecified: Secondary | ICD-10-CM | POA: Diagnosis not present

## 2021-03-02 DIAGNOSIS — Z0000 Encounter for general adult medical examination without abnormal findings: Secondary | ICD-10-CM | POA: Diagnosis not present

## 2021-03-02 DIAGNOSIS — R599 Enlarged lymph nodes, unspecified: Secondary | ICD-10-CM | POA: Diagnosis not present

## 2021-03-14 DIAGNOSIS — F209 Schizophrenia, unspecified: Secondary | ICD-10-CM | POA: Diagnosis not present

## 2021-03-14 DIAGNOSIS — F4310 Post-traumatic stress disorder, unspecified: Secondary | ICD-10-CM | POA: Diagnosis not present

## 2021-03-14 DIAGNOSIS — F3132 Bipolar disorder, current episode depressed, moderate: Secondary | ICD-10-CM | POA: Diagnosis not present

## 2021-03-18 DIAGNOSIS — F4310 Post-traumatic stress disorder, unspecified: Secondary | ICD-10-CM | POA: Diagnosis not present

## 2021-03-18 DIAGNOSIS — F902 Attention-deficit hyperactivity disorder, combined type: Secondary | ICD-10-CM | POA: Diagnosis not present

## 2021-03-18 DIAGNOSIS — F3132 Bipolar disorder, current episode depressed, moderate: Secondary | ICD-10-CM | POA: Diagnosis not present

## 2021-03-20 ENCOUNTER — Ambulatory Visit: Payer: BC Managed Care – PPO | Admitting: Allergy & Immunology

## 2021-03-20 ENCOUNTER — Encounter: Payer: Self-pay | Admitting: Allergy & Immunology

## 2021-03-20 ENCOUNTER — Other Ambulatory Visit: Payer: Self-pay

## 2021-03-20 VITALS — BP 128/80 | HR 71 | Temp 98.2°F | Resp 18 | Ht 68.0 in | Wt 210.8 lb

## 2021-03-20 DIAGNOSIS — J310 Chronic rhinitis: Secondary | ICD-10-CM

## 2021-03-20 DIAGNOSIS — J302 Other seasonal allergic rhinitis: Secondary | ICD-10-CM

## 2021-03-20 DIAGNOSIS — K9049 Malabsorption due to intolerance, not elsewhere classified: Secondary | ICD-10-CM

## 2021-03-20 DIAGNOSIS — J3089 Other allergic rhinitis: Secondary | ICD-10-CM

## 2021-03-20 DIAGNOSIS — J4520 Mild intermittent asthma, uncomplicated: Secondary | ICD-10-CM

## 2021-03-20 MED ORDER — CETIRIZINE HCL 10 MG PO TABS: 10.0000 mg | ORAL_TABLET | Freq: Every day | ORAL | 2 refills | Status: AC

## 2021-03-20 MED ORDER — ALBUTEROL SULFATE HFA 108 (90 BASE) MCG/ACT IN AERS: INHALATION_SPRAY | RESPIRATORY_TRACT | 2 refills | Status: AC

## 2021-03-20 MED ORDER — FLUTICASONE PROPIONATE 50 MCG/ACT NA SUSP: 2.0000 | Freq: Every day | NASAL | 2 refills | Status: AC

## 2021-03-20 NOTE — Patient Instructions (Addendum)
1. Mild intermittent asthma, uncomplicated - Lung testing looked normal. - We are going to send in albuterol just to see if this helps any of these episodes. - If you are needing albuterol frequently at the next visit, we can be more aggressive. - Spacer sample and demonstration provided. - Daily controller medication(s): NONE (FOR NOW AT LEAST) - Prior to physical activity: albuterol 2 puffs 10-15 minutes before physical activity. - Rescue medications: albuterol 4 puffs every 4-6 hours as needed - Asthma control goals:  * Full participation in all desired activities (may need albuterol before activity) * Albuterol use two time or less a week on average (not counting use with activity) * Cough interfering with sleep two time or less a month * Oral steroids no more than once a year * No hospitalizations  2. Chronic rhinitis - Testing today showed: weeds, trees, indoor molds, outdoor molds, dust mites, cat, dog and cockroach - Copy of test results provided.  - Avoidance measures provided. - Start taking: Zyrtec (cetirizine)  tablet once daily and Flonase (fluticasone) two sprays per nostril daily - You can use an extra dose of the antihistamine, if needed, for breakthrough symptoms.  - Consider nasal saline rinses 1-2 times daily to remove allergens from the nasal cavities as well as help with mucous clearance (this is especially helpful to do before the nasal sprays are given) - Consider allergy shots as a means of long-term control. - Allergy shots "re-train" and "reset" the immune system to ignore environmental allergens and decrease the resulting immune response to those allergens (sneezing, itchy watery eyes, runny nose, nasal congestion, etc).    - Allergy shots improve symptoms in 75-85% of patients.  - We can discuss more at the next appointment if the medications are not working for you.  3. Food intolerance - Testing was negative to onion celery. - We would have to do blood  work for [employer] if you want that. 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 - I do not think that an EpiPen is needed at this time, but if you want it just to reassure you, we can certainly send this in.  4. Return in about 2 months (around 05/20/2021).    Please inform us of any Emergency Department visits, hospitalizations, or changes in symptoms. Call us before going to the ED for breathing or allergy symptoms since we might be able to fit you in for a sick visit. Feel free to contact us anytime with any questions, problems, or concerns.  It was a pleasure to meet you today!  Websites that have reliable patient information: 1. American Academy of Asthma, Allergy, and Immunology: www.aaaai.org 2. Food Allergy Research and Education (FARE): foodallergy.org 3. Mothers of Asthmatics: http://www.asthmacommunitynetwork.org 4. American College of Allergy, Asthma, and Immunology: www.acaai.org   COVID-19 Vaccine Information can be found at: [email or web address] For questions related to vaccine distribution or appointments, please email [email or web address] or call [phone].   We realize that you might be concerned about having an allergic reaction to the COVID19 vaccines. To help with that concern, WE ARE OFFERING THE COVID19 VACCINES IN OUR OFFICE! Ask the front desk for dates!     "Like" [place] on Facebook and Instagram for our latest updates!      A healthy democracy works best when [employer] participate! Make sure you are registered to vote! If you have moved  or changed any of your contact information, you will need to get this updated before voting!  In some cases, you MAY be able to register to vote online: [email or web address]    The oral allergy syndrome (OAS) or pollen-food allergy  syndrome (PFAS) is a relatively common form of food allergy, particularly in adults. It typically occurs in people who have pollen allergies when the immune system "sees" proteins on the food that look like proteins on the pollen. This results in the allergy antibody (IgE) binding to the food instead of the pollen. Patients typically report itching and/or mild swelling of the mouth and throat immediately following ingestion of certain uncooked fruits (including nuts) or raw vegetables. Only a very small number of affected individuals experience systemic allergic reactions, such as anaphylaxis which occurs with true food allergies.      1. Control-Buffer 50% Glycerol Negative   2. Control-Histamine 1 mg/ml 2 +  3. Albumin saline Negative   4. Bahia Negative   5. [place] Negative   6. Johnson Negative   7. [place] Blue Negative   8. Meadow Fescue Negative   9. Perennial Rye Negative   10. Sweet Vernal Negative   11. Timothy Negative   12. Cocklebur Negative   13. Burweed Marshelder Negative   14. Ragweed, short Negative   15. Ragweed, Giant Negative   16. Plantain,  English Negative   17. Lamb's Quarters Negative   18. Sheep Sorrell Negative   19. Rough Pigweed Negative   20. Marsh Elder, Rough Negative   21. Mugwort, Common Negative   22. Ash mix Negative   23. Birch mix 2+   24. Beech American Negative   25. Box, Elder 2+   26. Cedar, red Negative   27. Cottonwood, [place] Negative   28. Elm mix Negative   29. Hickory Negative   30. Maple mix 2+   31. Oak, [place] mix Negative   32. Pecan Pollen Negative   33. Pine mix Negative   34. Sycamore Eastern Negative   35. Walnut, Black Pollen Negative   36. Alternaria alternata Negative   37. Cladosporium Herbarum Negative   38. Aspergillus mix Negative   39. Penicillium mix Negative   40. Bipolaris sorokiniana (Helminthosporium) Negative   41. Drechslera spicifera (Curvularia) Negative   42. Mucor plumbeus Negative   43.  Fusarium moniliforme Negative   44. Aureobasidium pullulans (pullulara) Negative   45. Rhizopus oryzae Negative   46. Botrytis cinera Negative   47. Epicoccum nigrum Negative   48. Phoma betae Negative   49. Candida Albicans Negative   50. Trichophyton mentagrophytes Negative   51. Mite, D Farinae  5,000 AU/ml Negative   52. Mite, D Pteronyssinus  5,000 AU/ml 2+   53. Cat Hair 10,000 BAU/ml 2+   54.  Dog Epithelia Negative   55. Mixed Feathers Negative   56. Horse Epithelia Negative   57. Cockroach, German Negative   58. Mouse Negative   59. Tobacco Leaf Negative      Control Negative   [place] Negative   Johnson Negative   7 Grass Negative   Ragweed mix Negative   Weed mix 2+   Mold 1 2+   Mold 2 3+   Mold 3 2+   Mold 4 3+   Dog 2+   Cockroach 2+    Control-Histamine 1 mg/ml 2+   49. Onion Negative   52. Celery Negative     Reducing Pollen Exposure  The American Academy of Allergy, Asthma  and Immunology suggests the following steps to reduce your exposure to pollen during allergy seasons.    1. Do not hang sheets or clothing out to dry; pollen may collect on these items. 2. Do not mow lawns or spend time around freshly cut grass; mowing stirs up pollen. 3. Keep windows closed at night.  Keep car windows closed while driving. 4. Minimize morning activities outdoors, a time when pollen counts are usually at their highest. 5. Stay indoors as much as possible when pollen counts or humidity is high and on windy days when pollen tends to remain in the air longer. 6. Use air conditioning when possible.  Many air conditioners have filters that trap the pollen spores. 7. Use a HEPA room air filter to remove pollen form the indoor air you breathe.  Control of Mold Allergen   Mold and fungi can grow on a variety of surfaces provided certain temperature and moisture conditions exist.  Outdoor molds grow on plants, decaying vegetation and soil.  The major outdoor mold,  Alternaria and Cladosporium, are found in very high numbers during hot and dry conditions.  Generally, a late Summer - Fall peak is seen for common outdoor fungal spores.  Rain will temporarily lower outdoor mold spore count, but counts rise rapidly when the rainy period ends.  The most important indoor molds are Aspergillus and Penicillium.  Dark, humid and poorly ventilated basements are ideal sites for mold growth.  The next most common sites of mold growth are the bathroom and the kitchen.  Outdoor (Seasonal) Mold Control  Positive outdoor molds via skin testing: Alternaria, Cladosporium, Bipolaris (Helminthsporium), Drechslera (Curvalaria) and Mucor  1. Use air conditioning and keep windows closed 2. Avoid exposure to decaying vegetation. 3. Avoid leaf raking. 4. Avoid grain handling. 5. Consider wearing a face mask if working in moldy areas.  6.   Indoor (Perennial) Mold Control   Positive indoor molds via skin testing: Aspergillus, Penicillium, Fusarium, Aureobasidium (Pullulara) and Rhizopus  1. Maintain humidity below 50%. 2. Clean washable surfaces with 5% bleach solution. 3. Remove sources e.g. contaminated carpets.     Control of Dog or Cat Allergen  Avoidance is the best way to manage a dog or cat allergy. If you have a dog or cat and are allergic to dog or cats, consider removing the dog or cat from the home. If you have a dog or cat but don't want to find it a new home, or if your family wants a pet even though someone in the household is allergic, here are some strategies that may help keep symptoms at bay:  1. Keep the pet out of your bedroom and restrict it to only a few rooms. Be advised that keeping the dog or cat in only one room will not limit the allergens to that room. 2. Don't pet, hug or kiss the dog or cat; if you do, wash your hands with soap and water. 3. High-efficiency particulate air (HEPA) cleaners run continuously in a bedroom or living room can reduce  allergen levels over time. 4. Regular use of a high-efficiency vacuum cleaner or a central vacuum can reduce allergen levels. 5. Giving your dog or cat a bath at least once a week can reduce airborne allergen.  Control of Dust Mite Allergen    Dust mites play a major role in allergic asthma and rhinitis.  They occur in environments with high humidity wherever human skin is found.  Dust mites absorb humidity from the atmosphere (  ie, they do not drink) and feed on organic matter (including shed human and animal skin).  Dust mites are a microscopic type of insect that you cannot see with the naked eye.  High levels of dust mites have been detected from mattresses, pillows, carpets, upholstered furniture, bed covers, clothes, soft toys and any woven material.  The principal allergen of the dust mite is found in its feces.  A gram of dust may contain 1,000 mites and 250,000 fecal particles.  Mite antigen is easily measured in the air during house cleaning activities.  Dust mites do not bite and do not cause harm to humans, other than by triggering allergies/asthma.    Ways to decrease your exposure to dust mites in your home:  1. Encase mattresses, box springs and pillows with a mite-impermeable barrier or cover   2. Wash sheets, blankets and drapes weekly in hot water (130 F) with detergent and dry them in a dryer on the hot setting.  3. Have the room cleaned frequently with a vacuum cleaner and a damp dust-mop.  For carpeting or rugs, vacuuming with a vacuum cleaner equipped with a high-efficiency particulate air (HEPA) filter.  The dust mite allergic individual should not be in a room which is being cleaned and should wait 1 hour after cleaning before going into the room. 4. Do not sleep on upholstered furniture (eg, couches).   5. If possible removing carpeting, upholstered furniture and drapery from the home is ideal.  Horizontal blinds should be eliminated in the rooms where the person spends the  most time (bedroom, study, television room).  Washable vinyl, roller-type shades are optimal. 6. Remove all non-washable stuffed toys from the bedroom.  Wash stuffed toys weekly like sheets and blankets above.   7. Reduce indoor humidity to less than 50%.  Inexpensive humidity monitors can be purchased at most hardware stores.  Do not use a humidifier as can make the problem worse and are not recommended.  Control of Cockroach Allergen  Cockroach allergen has been identified as an important cause of acute attacks of asthma, especially in urban settings.  There are fifty-five species of cockroach that exist in the [place], however only three, the [place], [place] species produce allergen that can affect patients with Asthma.  Allergens can be obtained from fecal particles, egg casings and secretions from cockroaches.    1. Remove food sources. 2. Reduce access to water. 3. Seal access and entry points. 4. Spray runways with 0.5-1% Diazinon or Chlorpyrifos 5. Blow boric acid power under stoves and refrigerator. 6. Place bait stations (hydramethylnon) at feeding sites.  Allergy Shots   Allergies are the result of a chain reaction that starts in the immune system. Your immune system controls how your body defends itself. For instance, if you have an allergy to pollen, your immune system identifies pollen as an invader or allergen. Your immune system overreacts by producing antibodies called Immunoglobulin E (IgE). These antibodies travel to cells that release chemicals, causing an allergic reaction.  The concept behind allergy immunotherapy, whether it is received in the form of shots or tablets, is that the immune system can be desensitized to specific allergens that trigger allergy symptoms. Although it requires time and patience, the payback can be long-term relief.  How Do Allergy Shots Work?  Allergy shots work much like a vaccine. Your body responds to injected amounts of  a particular allergen given in increasing doses, eventually developing a resistance and tolerance to it.  Allergy shots can lead to decreased, minimal or no allergy symptoms.  There generally are two phases: build-up and maintenance. Build-up often ranges from three to six months and involves receiving injections with increasing amounts of the allergens. The shots are typically given once or twice a week, though more rapid build-up schedules are sometimes used.  The maintenance phase begins when the most effective dose is reached. This dose is different for each person, depending on how allergic you are and your response to the build-up injections. Once the maintenance dose is reached, there are longer periods between injections, typically two to four weeks.  Occasionally doctors give cortisone-type shots that can temporarily reduce allergy symptoms. These types of shots are different and should not be confused with allergy immunotherapy shots.  Who Can Be Treated with Allergy Shots?  Allergy shots may be a good treatment approach for people with allergic rhinitis (hay fever), allergic asthma, conjunctivitis (eye allergy) or stinging insect allergy.   Before deciding to begin allergy shots, you should consider:  . The length of allergy season and the severity of your symptoms . Whether medications and/or changes to your environment can control your symptoms . Your desire to avoid long-term medication use . Time: allergy immunotherapy requires a major time commitment . Cost: may vary depending on your insurance coverage  Allergy shots for children age 65 and older are effective and often well tolerated. They might prevent the onset of new allergen sensitivities or the progression to asthma.  Allergy shots are not started on patients who are pregnant but can be continued on patients who become pregnant while receiving them. In some patients with other medical conditions or who take certain  common medications, allergy shots may be of risk. It is important to mention other medications you talk to your allergist.   When Will I Feel Better?  Some may experience decreased allergy symptoms during the build-up phase. For others, it may take as long as 12 months on the maintenance dose. If there is no improvement after a year of maintenance, your allergist will discuss other treatment options with you.  If you aren't responding to allergy shots, it may be because there is not enough dose of the allergen in your vaccine or there are missing allergens that were not identified during your allergy testing. Other reasons could be that there are high levels of the allergen in your environment or major exposure to non-allergic triggers like tobacco smoke.  What Is the Length of Treatment?  Once the maintenance dose is reached, allergy shots are generally continued for three to five years. The decision to stop should be discussed with your allergist at that time. Some people may experience a permanent reduction of allergy symptoms. Others may relapse and a longer course of allergy shots can be considered.  What Are the Possible Reactions?  The two types of adverse reactions that can occur with allergy shots are local and systemic. Common local reactions include very mild redness and swelling at the injection site, which can happen immediately or several hours after. A systemic reaction, which is less common, affects the entire body or a particular body system. They are usually mild and typically respond quickly to medications. Signs include increased allergy symptoms such as sneezing, a stuffy nose or hives.  Rarely, a serious systemic reaction called anaphylaxis can develop. Symptoms include swelling in the throat, wheezing, a feeling of tightness in the chest, nausea or dizziness. Most serious systemic reactions develop within 30 minutes  of allergy shots. This is why it is strongly recommended you  wait in your doctor's office for 30 minutes after your injections. Your allergist is trained to watch for reactions, and his or her staff is trained and equipped with the proper medications to identify and treat them.  Who Should Administer Allergy Shots?  The preferred location for receiving shots is your prescribing allergist's office. Injections can sometimes be given at another facility where the physician and staff are trained to recognize and treat reactions, and have received instructions by your prescribing allergist.
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
## 2021-03-28 DIAGNOSIS — F4310 Post-traumatic stress disorder, unspecified: Secondary | ICD-10-CM | POA: Diagnosis not present

## 2021-03-28 DIAGNOSIS — F209 Schizophrenia, unspecified: Secondary | ICD-10-CM | POA: Diagnosis not present

## 2021-03-28 DIAGNOSIS — F3132 Bipolar disorder, current episode depressed, moderate: Secondary | ICD-10-CM | POA: Diagnosis not present

## 2021-04-04 DIAGNOSIS — F4310 Post-traumatic stress disorder, unspecified: Secondary | ICD-10-CM | POA: Diagnosis not present

## 2021-04-04 DIAGNOSIS — F902 Attention-deficit hyperactivity disorder, combined type: Secondary | ICD-10-CM | POA: Diagnosis not present

## 2021-04-04 DIAGNOSIS — F3132 Bipolar disorder, current episode depressed, moderate: Secondary | ICD-10-CM | POA: Diagnosis not present

## 2021-04-10 DIAGNOSIS — F3132 Bipolar disorder, current episode depressed, moderate: Secondary | ICD-10-CM | POA: Diagnosis not present

## 2021-04-10 DIAGNOSIS — F4310 Post-traumatic stress disorder, unspecified: Secondary | ICD-10-CM | POA: Diagnosis not present

## 2021-04-10 DIAGNOSIS — F209 Schizophrenia, unspecified: Secondary | ICD-10-CM | POA: Diagnosis not present

## 2021-04-11 DIAGNOSIS — Z01419 Encounter for gynecological examination (general) (routine) without abnormal findings: Secondary | ICD-10-CM | POA: Diagnosis not present

## 2021-04-16 DIAGNOSIS — Z0000 Encounter for general adult medical examination without abnormal findings: Secondary | ICD-10-CM | POA: Diagnosis not present

## 2021-04-16 DIAGNOSIS — Z13220 Encounter for screening for lipoid disorders: Secondary | ICD-10-CM | POA: Diagnosis not present

## 2021-04-16 DIAGNOSIS — F4310 Post-traumatic stress disorder, unspecified: Secondary | ICD-10-CM | POA: Diagnosis not present

## 2021-04-16 DIAGNOSIS — L680 Hirsutism: Secondary | ICD-10-CM | POA: Diagnosis not present

## 2021-04-16 DIAGNOSIS — E1165 Type 2 diabetes mellitus with hyperglycemia: Secondary | ICD-10-CM | POA: Diagnosis not present

## 2021-04-16 DIAGNOSIS — F209 Schizophrenia, unspecified: Secondary | ICD-10-CM | POA: Diagnosis not present

## 2021-04-16 DIAGNOSIS — F3132 Bipolar disorder, current episode depressed, moderate: Secondary | ICD-10-CM | POA: Diagnosis not present

## 2021-04-16 DIAGNOSIS — D509 Iron deficiency anemia, unspecified: Secondary | ICD-10-CM | POA: Diagnosis not present

## 2021-04-26 DIAGNOSIS — F3132 Bipolar disorder, current episode depressed, moderate: Secondary | ICD-10-CM | POA: Diagnosis not present

## 2021-04-26 DIAGNOSIS — F209 Schizophrenia, unspecified: Secondary | ICD-10-CM | POA: Diagnosis not present

## 2021-04-26 DIAGNOSIS — F4310 Post-traumatic stress disorder, unspecified: Secondary | ICD-10-CM | POA: Diagnosis not present

## 2021-05-02 DIAGNOSIS — F3132 Bipolar disorder, current episode depressed, moderate: Secondary | ICD-10-CM | POA: Diagnosis not present

## 2021-05-02 DIAGNOSIS — F4310 Post-traumatic stress disorder, unspecified: Secondary | ICD-10-CM | POA: Diagnosis not present

## 2021-05-02 DIAGNOSIS — F209 Schizophrenia, unspecified: Secondary | ICD-10-CM | POA: Diagnosis not present

## 2021-05-03 DIAGNOSIS — F17210 Nicotine dependence, cigarettes, uncomplicated: Secondary | ICD-10-CM | POA: Diagnosis not present

## 2021-05-03 DIAGNOSIS — L2082 Flexural eczema: Secondary | ICD-10-CM | POA: Diagnosis not present

## 2021-05-03 DIAGNOSIS — L700 Acne vulgaris: Secondary | ICD-10-CM | POA: Diagnosis not present

## 2021-05-03 DIAGNOSIS — F314 Bipolar disorder, current episode depressed, severe, without psychotic features: Secondary | ICD-10-CM | POA: Diagnosis not present

## 2021-05-08 DIAGNOSIS — F209 Schizophrenia, unspecified: Secondary | ICD-10-CM | POA: Diagnosis not present

## 2021-05-08 DIAGNOSIS — F3132 Bipolar disorder, current episode depressed, moderate: Secondary | ICD-10-CM | POA: Diagnosis not present

## 2021-05-08 DIAGNOSIS — F4310 Post-traumatic stress disorder, unspecified: Secondary | ICD-10-CM | POA: Diagnosis not present

## 2021-05-16 DIAGNOSIS — F4310 Post-traumatic stress disorder, unspecified: Secondary | ICD-10-CM | POA: Diagnosis not present

## 2021-05-16 DIAGNOSIS — F3132 Bipolar disorder, current episode depressed, moderate: Secondary | ICD-10-CM | POA: Diagnosis not present

## 2021-05-16 DIAGNOSIS — F209 Schizophrenia, unspecified: Secondary | ICD-10-CM | POA: Diagnosis not present

## 2021-05-16 IMAGING — CR DG CHEST 2V
2 series · 2 of 2 positions shown · non-contrast
Comparison: 06/28/2019

CLINICAL DATA: Swallowed piece of glass

EXAM:
CHEST - 2 VIEW

[w chest pa]
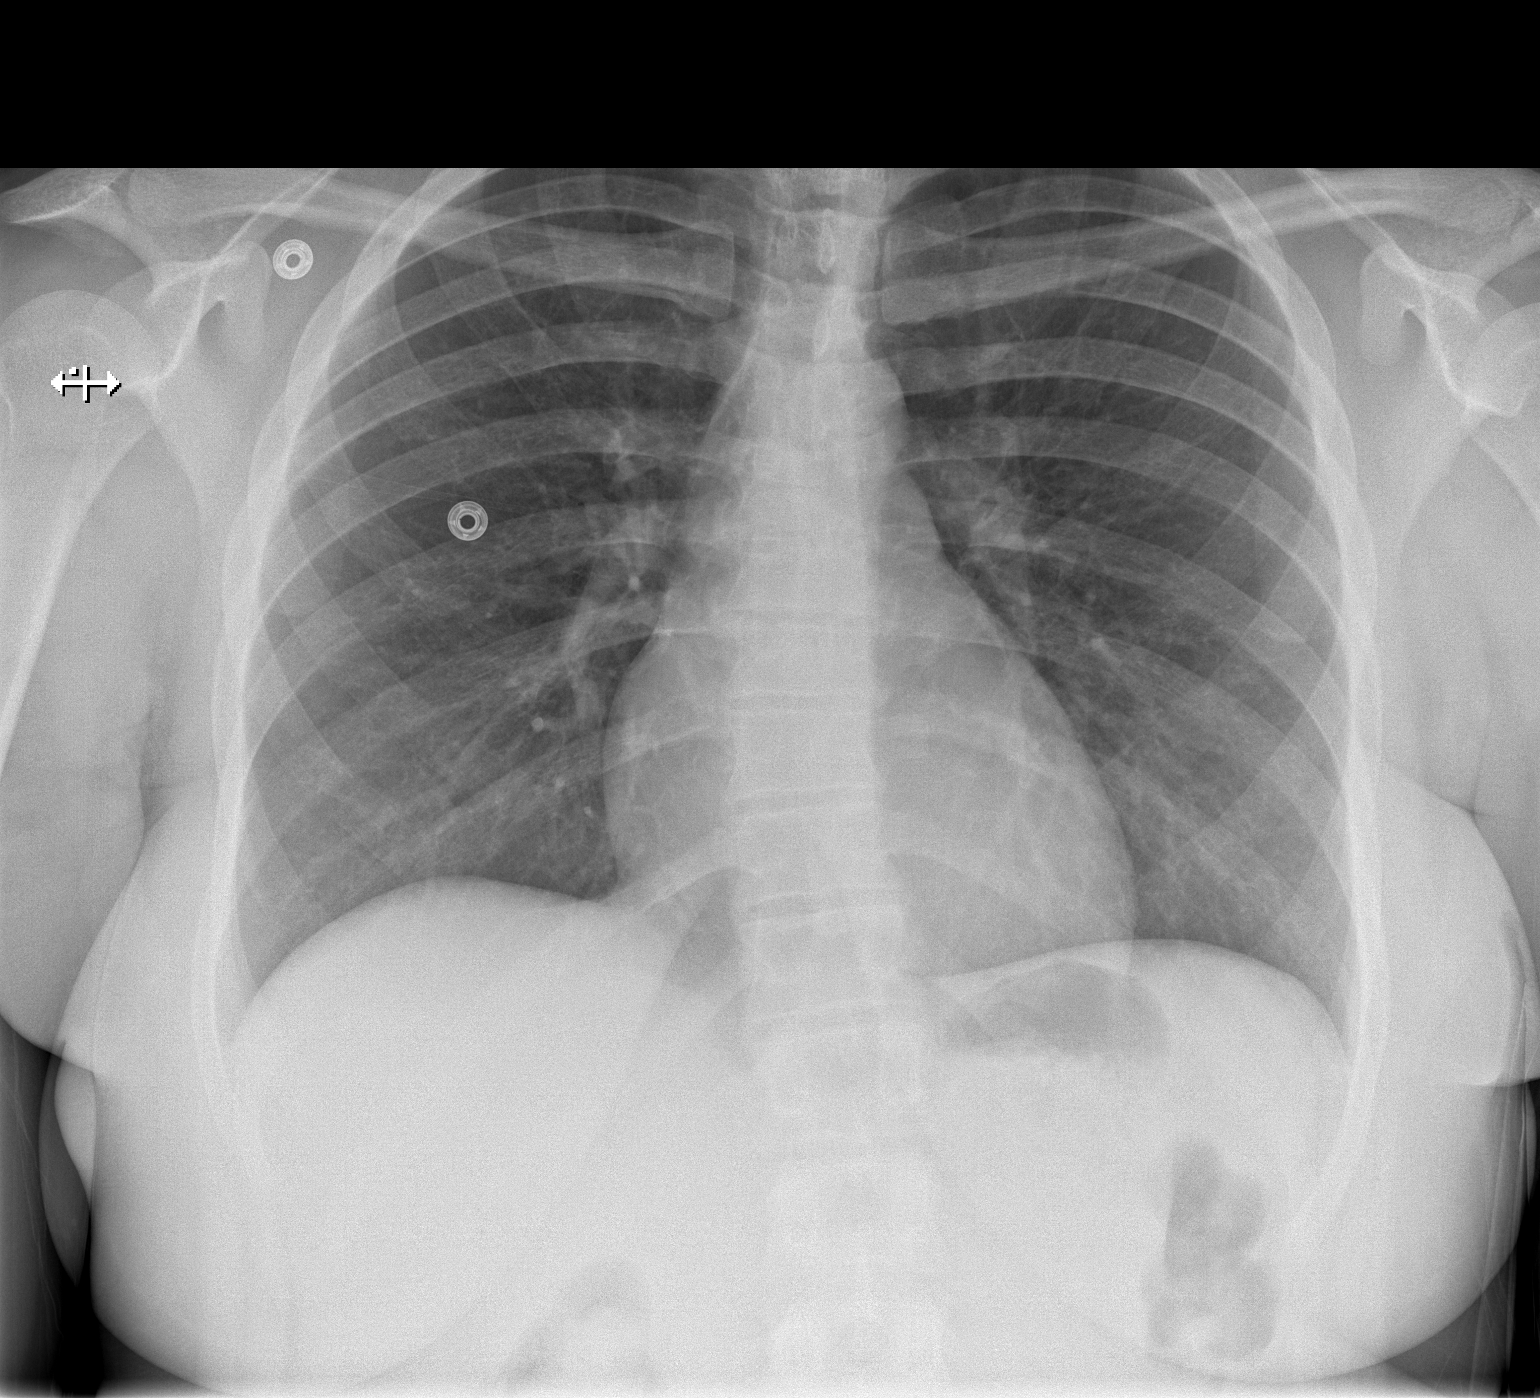

[w chest lat]
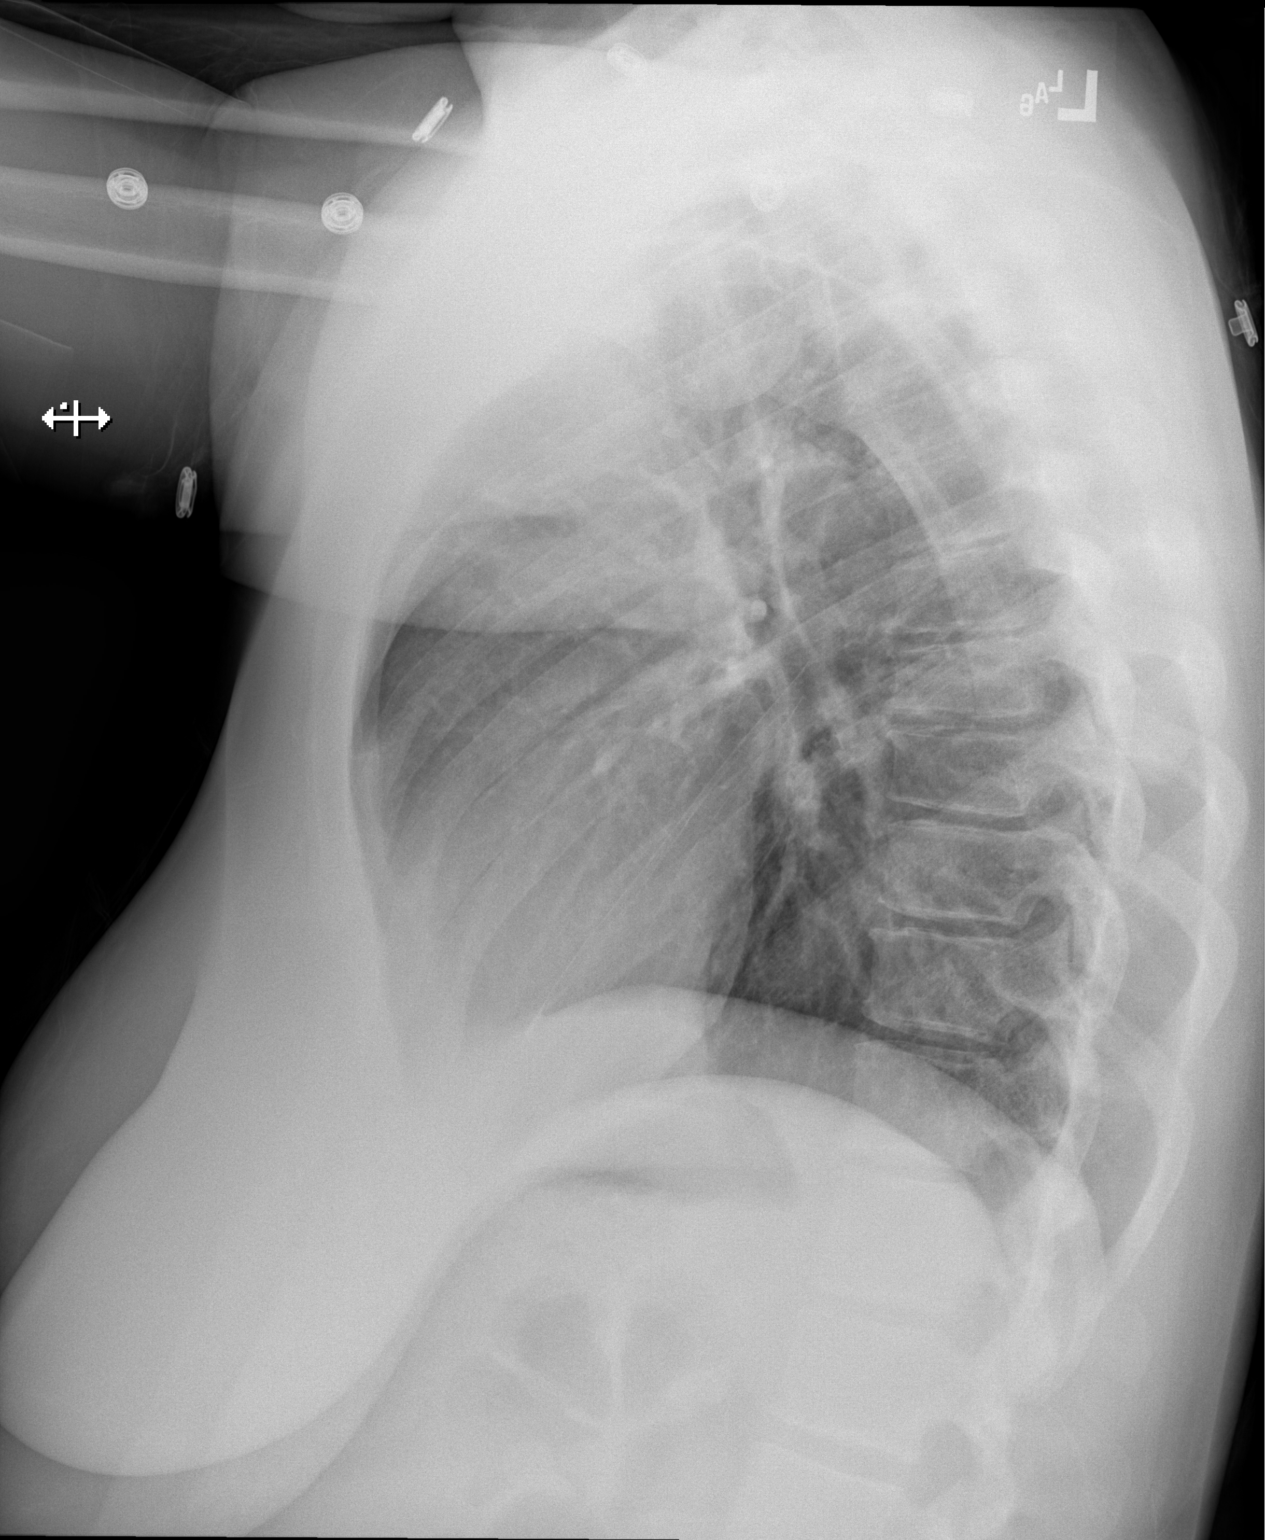

[2 of 2 positions shown; findings below may reference images not displayed]

FINDINGS: Heart and mediastinal contours are within normal limits. No focal
opacities or effusions. No acute bony abnormality. No radiopaque
foreign body.
IMPRESSION: Negative.

## 2021-05-17 IMAGING — CR DG ABDOMEN 1V
1 series · 1 of 1 positions shown · non-contrast
Comparison: None.

CLINICAL DATA: Foreign body ingestion

EXAM:
ABDOMEN - 1 VIEW

[t abdomen supine]
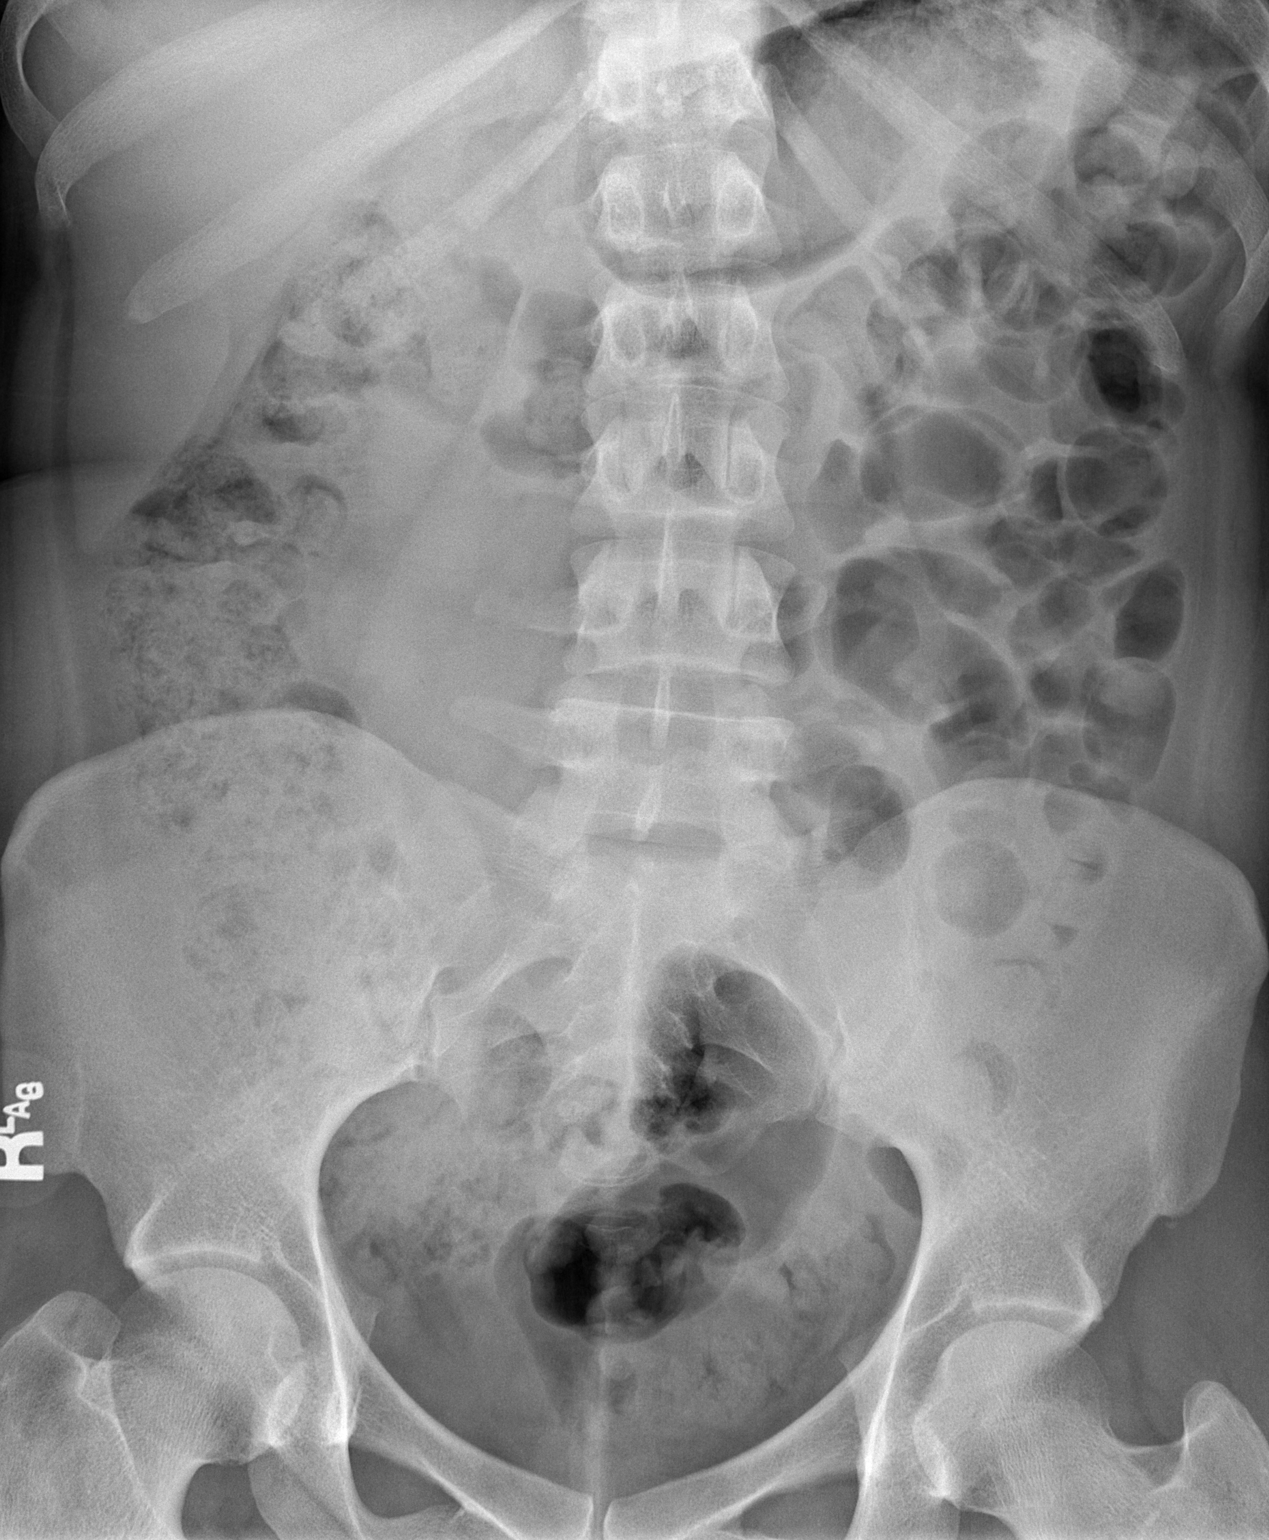

[1 of 1 positions shown; findings below may reference images not displayed]

FINDINGS: Normal abdominal gas pattern. No gross free intraperitoneal gas. No
retained radiopaque foreign body within the abdomen. No
organomegaly. No abnormal calcifications within the abdomen. No
acute bone abnormality.
IMPRESSION: Negative.  No retained radiopaque foreign body within the abdomen.

## 2021-05-21 DIAGNOSIS — F3132 Bipolar disorder, current episode depressed, moderate: Secondary | ICD-10-CM | POA: Diagnosis not present

## 2021-05-21 DIAGNOSIS — F209 Schizophrenia, unspecified: Secondary | ICD-10-CM | POA: Diagnosis not present

## 2021-05-21 DIAGNOSIS — F4310 Post-traumatic stress disorder, unspecified: Secondary | ICD-10-CM | POA: Diagnosis not present

## 2021-05-27 DIAGNOSIS — F3132 Bipolar disorder, current episode depressed, moderate: Secondary | ICD-10-CM | POA: Diagnosis not present

## 2021-05-27 DIAGNOSIS — F209 Schizophrenia, unspecified: Secondary | ICD-10-CM | POA: Diagnosis not present

## 2021-05-27 DIAGNOSIS — F4310 Post-traumatic stress disorder, unspecified: Secondary | ICD-10-CM | POA: Diagnosis not present

## 2021-06-06 DIAGNOSIS — F3132 Bipolar disorder, current episode depressed, moderate: Secondary | ICD-10-CM | POA: Diagnosis not present

## 2021-06-06 DIAGNOSIS — F209 Schizophrenia, unspecified: Secondary | ICD-10-CM | POA: Diagnosis not present

## 2021-06-06 DIAGNOSIS — F4310 Post-traumatic stress disorder, unspecified: Secondary | ICD-10-CM | POA: Diagnosis not present

## 2021-06-13 DIAGNOSIS — F209 Schizophrenia, unspecified: Secondary | ICD-10-CM | POA: Diagnosis not present

## 2021-06-13 DIAGNOSIS — F3132 Bipolar disorder, current episode depressed, moderate: Secondary | ICD-10-CM | POA: Diagnosis not present

## 2021-06-13 DIAGNOSIS — F4310 Post-traumatic stress disorder, unspecified: Secondary | ICD-10-CM | POA: Diagnosis not present

## 2021-06-19 DIAGNOSIS — F209 Schizophrenia, unspecified: Secondary | ICD-10-CM | POA: Diagnosis not present

## 2021-06-19 DIAGNOSIS — F4310 Post-traumatic stress disorder, unspecified: Secondary | ICD-10-CM | POA: Diagnosis not present

## 2021-06-19 DIAGNOSIS — F3132 Bipolar disorder, current episode depressed, moderate: Secondary | ICD-10-CM | POA: Diagnosis not present

## 2021-07-11 DIAGNOSIS — F4310 Post-traumatic stress disorder, unspecified: Secondary | ICD-10-CM | POA: Diagnosis not present

## 2021-07-11 DIAGNOSIS — F3132 Bipolar disorder, current episode depressed, moderate: Secondary | ICD-10-CM | POA: Diagnosis not present

## 2021-07-11 DIAGNOSIS — F209 Schizophrenia, unspecified: Secondary | ICD-10-CM | POA: Diagnosis not present

## 2021-07-18 DIAGNOSIS — F4310 Post-traumatic stress disorder, unspecified: Secondary | ICD-10-CM | POA: Diagnosis not present

## 2021-07-18 DIAGNOSIS — F3132 Bipolar disorder, current episode depressed, moderate: Secondary | ICD-10-CM | POA: Diagnosis not present

## 2021-07-18 DIAGNOSIS — F209 Schizophrenia, unspecified: Secondary | ICD-10-CM | POA: Diagnosis not present

## 2021-07-19 DIAGNOSIS — Z23 Encounter for immunization: Secondary | ICD-10-CM | POA: Diagnosis not present

## 2021-07-19 DIAGNOSIS — S91132A Puncture wound without foreign body of left great toe without damage to nail, initial encounter: Secondary | ICD-10-CM | POA: Diagnosis not present

## 2021-08-01 DIAGNOSIS — F4310 Post-traumatic stress disorder, unspecified: Secondary | ICD-10-CM | POA: Diagnosis not present

## 2021-08-01 DIAGNOSIS — F209 Schizophrenia, unspecified: Secondary | ICD-10-CM | POA: Diagnosis not present

## 2021-08-01 DIAGNOSIS — F3132 Bipolar disorder, current episode depressed, moderate: Secondary | ICD-10-CM | POA: Diagnosis not present

## 2021-08-14 DIAGNOSIS — F1120 Opioid dependence, uncomplicated: Secondary | ICD-10-CM | POA: Diagnosis not present

## 2021-08-15 DIAGNOSIS — F209 Schizophrenia, unspecified: Secondary | ICD-10-CM | POA: Diagnosis not present

## 2021-08-15 DIAGNOSIS — F3132 Bipolar disorder, current episode depressed, moderate: Secondary | ICD-10-CM | POA: Diagnosis not present

## 2021-08-15 DIAGNOSIS — F4310 Post-traumatic stress disorder, unspecified: Secondary | ICD-10-CM | POA: Diagnosis not present

## 2021-08-22 DIAGNOSIS — F3132 Bipolar disorder, current episode depressed, moderate: Secondary | ICD-10-CM | POA: Diagnosis not present

## 2021-08-22 DIAGNOSIS — F4310 Post-traumatic stress disorder, unspecified: Secondary | ICD-10-CM | POA: Diagnosis not present

## 2021-08-22 DIAGNOSIS — F209 Schizophrenia, unspecified: Secondary | ICD-10-CM | POA: Diagnosis not present

## 2021-08-29 DIAGNOSIS — F209 Schizophrenia, unspecified: Secondary | ICD-10-CM | POA: Diagnosis not present

## 2021-08-29 DIAGNOSIS — F4310 Post-traumatic stress disorder, unspecified: Secondary | ICD-10-CM | POA: Diagnosis not present

## 2021-08-29 DIAGNOSIS — F3132 Bipolar disorder, current episode depressed, moderate: Secondary | ICD-10-CM | POA: Diagnosis not present

## 2021-09-12 DIAGNOSIS — F4310 Post-traumatic stress disorder, unspecified: Secondary | ICD-10-CM | POA: Diagnosis not present

## 2021-09-12 DIAGNOSIS — F3132 Bipolar disorder, current episode depressed, moderate: Secondary | ICD-10-CM | POA: Diagnosis not present

## 2021-09-12 DIAGNOSIS — F209 Schizophrenia, unspecified: Secondary | ICD-10-CM | POA: Diagnosis not present

## 2021-09-18 DIAGNOSIS — F1120 Opioid dependence, uncomplicated: Secondary | ICD-10-CM | POA: Diagnosis not present

## 2021-10-02 DIAGNOSIS — F3132 Bipolar disorder, current episode depressed, moderate: Secondary | ICD-10-CM | POA: Diagnosis not present

## 2021-10-02 DIAGNOSIS — F4310 Post-traumatic stress disorder, unspecified: Secondary | ICD-10-CM | POA: Diagnosis not present

## 2021-10-02 DIAGNOSIS — F209 Schizophrenia, unspecified: Secondary | ICD-10-CM | POA: Diagnosis not present

## 2021-10-08 DIAGNOSIS — F3132 Bipolar disorder, current episode depressed, moderate: Secondary | ICD-10-CM | POA: Diagnosis not present

## 2021-10-08 DIAGNOSIS — F209 Schizophrenia, unspecified: Secondary | ICD-10-CM | POA: Diagnosis not present

## 2021-10-08 DIAGNOSIS — F4310 Post-traumatic stress disorder, unspecified: Secondary | ICD-10-CM | POA: Diagnosis not present

## 2021-10-31 DIAGNOSIS — F4310 Post-traumatic stress disorder, unspecified: Secondary | ICD-10-CM | POA: Diagnosis not present

## 2021-10-31 DIAGNOSIS — F209 Schizophrenia, unspecified: Secondary | ICD-10-CM | POA: Diagnosis not present

## 2021-10-31 DIAGNOSIS — F3132 Bipolar disorder, current episode depressed, moderate: Secondary | ICD-10-CM | POA: Diagnosis not present

## 2021-11-07 DIAGNOSIS — F3132 Bipolar disorder, current episode depressed, moderate: Secondary | ICD-10-CM | POA: Diagnosis not present

## 2021-11-07 DIAGNOSIS — F209 Schizophrenia, unspecified: Secondary | ICD-10-CM | POA: Diagnosis not present

## 2021-11-07 DIAGNOSIS — F4310 Post-traumatic stress disorder, unspecified: Secondary | ICD-10-CM | POA: Diagnosis not present

## 2021-11-21 DIAGNOSIS — F3132 Bipolar disorder, current episode depressed, moderate: Secondary | ICD-10-CM | POA: Diagnosis not present

## 2021-11-21 DIAGNOSIS — F209 Schizophrenia, unspecified: Secondary | ICD-10-CM | POA: Diagnosis not present

## 2021-11-21 DIAGNOSIS — F4310 Post-traumatic stress disorder, unspecified: Secondary | ICD-10-CM | POA: Diagnosis not present

## 2021-11-28 DIAGNOSIS — F209 Schizophrenia, unspecified: Secondary | ICD-10-CM | POA: Diagnosis not present

## 2021-11-28 DIAGNOSIS — F3132 Bipolar disorder, current episode depressed, moderate: Secondary | ICD-10-CM | POA: Diagnosis not present

## 2021-11-28 DIAGNOSIS — F4310 Post-traumatic stress disorder, unspecified: Secondary | ICD-10-CM | POA: Diagnosis not present

## 2021-12-06 DIAGNOSIS — F209 Schizophrenia, unspecified: Secondary | ICD-10-CM | POA: Diagnosis not present

## 2021-12-06 DIAGNOSIS — F4310 Post-traumatic stress disorder, unspecified: Secondary | ICD-10-CM | POA: Diagnosis not present

## 2021-12-06 DIAGNOSIS — F3132 Bipolar disorder, current episode depressed, moderate: Secondary | ICD-10-CM | POA: Diagnosis not present

## 2021-12-12 DIAGNOSIS — F209 Schizophrenia, unspecified: Secondary | ICD-10-CM | POA: Diagnosis not present

## 2021-12-12 DIAGNOSIS — F4310 Post-traumatic stress disorder, unspecified: Secondary | ICD-10-CM | POA: Diagnosis not present

## 2021-12-12 DIAGNOSIS — F3132 Bipolar disorder, current episode depressed, moderate: Secondary | ICD-10-CM | POA: Diagnosis not present

## 2022-01-02 DIAGNOSIS — E1165 Type 2 diabetes mellitus with hyperglycemia: Secondary | ICD-10-CM | POA: Diagnosis not present

## 2022-01-02 DIAGNOSIS — F209 Schizophrenia, unspecified: Secondary | ICD-10-CM | POA: Diagnosis not present

## 2022-01-02 DIAGNOSIS — F3132 Bipolar disorder, current episode depressed, moderate: Secondary | ICD-10-CM | POA: Diagnosis not present

## 2022-01-02 DIAGNOSIS — J019 Acute sinusitis, unspecified: Secondary | ICD-10-CM | POA: Diagnosis not present

## 2022-01-02 DIAGNOSIS — F4310 Post-traumatic stress disorder, unspecified: Secondary | ICD-10-CM | POA: Diagnosis not present

## 2022-01-21 DIAGNOSIS — F3132 Bipolar disorder, current episode depressed, moderate: Secondary | ICD-10-CM | POA: Diagnosis not present

## 2022-01-21 DIAGNOSIS — F209 Schizophrenia, unspecified: Secondary | ICD-10-CM | POA: Diagnosis not present

## 2022-01-21 DIAGNOSIS — F4310 Post-traumatic stress disorder, unspecified: Secondary | ICD-10-CM | POA: Diagnosis not present

## 2022-01-30 DIAGNOSIS — F3132 Bipolar disorder, current episode depressed, moderate: Secondary | ICD-10-CM | POA: Diagnosis not present

## 2022-01-30 DIAGNOSIS — F4310 Post-traumatic stress disorder, unspecified: Secondary | ICD-10-CM | POA: Diagnosis not present

## 2022-01-30 DIAGNOSIS — F209 Schizophrenia, unspecified: Secondary | ICD-10-CM | POA: Diagnosis not present

## 2022-02-20 DIAGNOSIS — F209 Schizophrenia, unspecified: Secondary | ICD-10-CM | POA: Diagnosis not present

## 2022-02-20 DIAGNOSIS — F4310 Post-traumatic stress disorder, unspecified: Secondary | ICD-10-CM | POA: Diagnosis not present

## 2022-02-20 DIAGNOSIS — F3132 Bipolar disorder, current episode depressed, moderate: Secondary | ICD-10-CM | POA: Diagnosis not present

## 2022-02-27 DIAGNOSIS — F4310 Post-traumatic stress disorder, unspecified: Secondary | ICD-10-CM | POA: Diagnosis not present

## 2022-02-27 DIAGNOSIS — F3132 Bipolar disorder, current episode depressed, moderate: Secondary | ICD-10-CM | POA: Diagnosis not present

## 2022-02-27 DIAGNOSIS — F209 Schizophrenia, unspecified: Secondary | ICD-10-CM | POA: Diagnosis not present

## 2022-03-06 DIAGNOSIS — F3132 Bipolar disorder, current episode depressed, moderate: Secondary | ICD-10-CM | POA: Diagnosis not present

## 2022-03-06 DIAGNOSIS — F209 Schizophrenia, unspecified: Secondary | ICD-10-CM | POA: Diagnosis not present

## 2022-03-06 DIAGNOSIS — F4310 Post-traumatic stress disorder, unspecified: Secondary | ICD-10-CM | POA: Diagnosis not present

## 2022-03-13 DIAGNOSIS — F209 Schizophrenia, unspecified: Secondary | ICD-10-CM | POA: Diagnosis not present

## 2022-03-13 DIAGNOSIS — F3132 Bipolar disorder, current episode depressed, moderate: Secondary | ICD-10-CM | POA: Diagnosis not present

## 2022-03-13 DIAGNOSIS — F4310 Post-traumatic stress disorder, unspecified: Secondary | ICD-10-CM | POA: Diagnosis not present

## 2022-04-09 DIAGNOSIS — F4310 Post-traumatic stress disorder, unspecified: Secondary | ICD-10-CM | POA: Diagnosis not present

## 2022-04-09 DIAGNOSIS — F3132 Bipolar disorder, current episode depressed, moderate: Secondary | ICD-10-CM | POA: Diagnosis not present

## 2022-04-09 DIAGNOSIS — F209 Schizophrenia, unspecified: Secondary | ICD-10-CM | POA: Diagnosis not present

## 2022-04-15 DIAGNOSIS — F3132 Bipolar disorder, current episode depressed, moderate: Secondary | ICD-10-CM | POA: Diagnosis not present

## 2022-04-15 DIAGNOSIS — F4310 Post-traumatic stress disorder, unspecified: Secondary | ICD-10-CM | POA: Diagnosis not present

## 2022-04-15 DIAGNOSIS — F209 Schizophrenia, unspecified: Secondary | ICD-10-CM | POA: Diagnosis not present

## 2022-04-22 DIAGNOSIS — F209 Schizophrenia, unspecified: Secondary | ICD-10-CM | POA: Diagnosis not present

## 2022-04-22 DIAGNOSIS — F4310 Post-traumatic stress disorder, unspecified: Secondary | ICD-10-CM | POA: Diagnosis not present

## 2022-04-22 DIAGNOSIS — F3132 Bipolar disorder, current episode depressed, moderate: Secondary | ICD-10-CM | POA: Diagnosis not present

## 2022-05-06 DIAGNOSIS — F209 Schizophrenia, unspecified: Secondary | ICD-10-CM | POA: Diagnosis not present

## 2022-05-06 DIAGNOSIS — F4310 Post-traumatic stress disorder, unspecified: Secondary | ICD-10-CM | POA: Diagnosis not present

## 2022-05-06 DIAGNOSIS — F3132 Bipolar disorder, current episode depressed, moderate: Secondary | ICD-10-CM | POA: Diagnosis not present

## 2022-05-15 DIAGNOSIS — F209 Schizophrenia, unspecified: Secondary | ICD-10-CM | POA: Diagnosis not present

## 2022-05-15 DIAGNOSIS — F3132 Bipolar disorder, current episode depressed, moderate: Secondary | ICD-10-CM | POA: Diagnosis not present

## 2022-05-15 DIAGNOSIS — F4310 Post-traumatic stress disorder, unspecified: Secondary | ICD-10-CM | POA: Diagnosis not present

## 2022-06-18 DIAGNOSIS — F4310 Post-traumatic stress disorder, unspecified: Secondary | ICD-10-CM | POA: Diagnosis not present

## 2022-06-18 DIAGNOSIS — F209 Schizophrenia, unspecified: Secondary | ICD-10-CM | POA: Diagnosis not present

## 2022-06-18 DIAGNOSIS — F3132 Bipolar disorder, current episode depressed, moderate: Secondary | ICD-10-CM | POA: Diagnosis not present

## 2022-06-26 DIAGNOSIS — F4310 Post-traumatic stress disorder, unspecified: Secondary | ICD-10-CM | POA: Diagnosis not present

## 2022-06-26 DIAGNOSIS — F209 Schizophrenia, unspecified: Secondary | ICD-10-CM | POA: Diagnosis not present

## 2022-06-26 DIAGNOSIS — F3132 Bipolar disorder, current episode depressed, moderate: Secondary | ICD-10-CM | POA: Diagnosis not present

## 2022-07-04 DIAGNOSIS — F331 Major depressive disorder, recurrent, moderate: Secondary | ICD-10-CM | POA: Diagnosis not present

## 2022-07-04 DIAGNOSIS — F411 Generalized anxiety disorder: Secondary | ICD-10-CM | POA: Diagnosis not present

## 2022-07-08 DIAGNOSIS — F209 Schizophrenia, unspecified: Secondary | ICD-10-CM | POA: Diagnosis not present

## 2022-07-08 DIAGNOSIS — F3132 Bipolar disorder, current episode depressed, moderate: Secondary | ICD-10-CM | POA: Diagnosis not present

## 2022-07-08 DIAGNOSIS — F4310 Post-traumatic stress disorder, unspecified: Secondary | ICD-10-CM | POA: Diagnosis not present

## 2022-07-15 DIAGNOSIS — F3132 Bipolar disorder, current episode depressed, moderate: Secondary | ICD-10-CM | POA: Diagnosis not present

## 2022-07-15 DIAGNOSIS — F4310 Post-traumatic stress disorder, unspecified: Secondary | ICD-10-CM | POA: Diagnosis not present

## 2022-07-15 DIAGNOSIS — F209 Schizophrenia, unspecified: Secondary | ICD-10-CM | POA: Diagnosis not present

## 2022-07-24 DIAGNOSIS — F3132 Bipolar disorder, current episode depressed, moderate: Secondary | ICD-10-CM | POA: Diagnosis not present

## 2022-07-24 DIAGNOSIS — F209 Schizophrenia, unspecified: Secondary | ICD-10-CM | POA: Diagnosis not present

## 2022-07-24 DIAGNOSIS — F4310 Post-traumatic stress disorder, unspecified: Secondary | ICD-10-CM | POA: Diagnosis not present

## 2022-08-05 DIAGNOSIS — F4310 Post-traumatic stress disorder, unspecified: Secondary | ICD-10-CM | POA: Diagnosis not present

## 2022-08-05 DIAGNOSIS — F209 Schizophrenia, unspecified: Secondary | ICD-10-CM | POA: Diagnosis not present

## 2022-08-05 DIAGNOSIS — F3132 Bipolar disorder, current episode depressed, moderate: Secondary | ICD-10-CM | POA: Diagnosis not present

## 2022-08-21 DIAGNOSIS — F4310 Post-traumatic stress disorder, unspecified: Secondary | ICD-10-CM | POA: Diagnosis not present

## 2022-08-21 DIAGNOSIS — F3132 Bipolar disorder, current episode depressed, moderate: Secondary | ICD-10-CM | POA: Diagnosis not present

## 2022-08-21 DIAGNOSIS — F209 Schizophrenia, unspecified: Secondary | ICD-10-CM | POA: Diagnosis not present

## 2022-09-04 DIAGNOSIS — F3132 Bipolar disorder, current episode depressed, moderate: Secondary | ICD-10-CM | POA: Diagnosis not present

## 2022-09-04 DIAGNOSIS — F4310 Post-traumatic stress disorder, unspecified: Secondary | ICD-10-CM | POA: Diagnosis not present

## 2022-09-04 DIAGNOSIS — F209 Schizophrenia, unspecified: Secondary | ICD-10-CM | POA: Diagnosis not present

## 2022-09-15 DIAGNOSIS — F3132 Bipolar disorder, current episode depressed, moderate: Secondary | ICD-10-CM | POA: Diagnosis not present

## 2022-09-15 DIAGNOSIS — F4310 Post-traumatic stress disorder, unspecified: Secondary | ICD-10-CM | POA: Diagnosis not present

## 2022-09-15 DIAGNOSIS — F209 Schizophrenia, unspecified: Secondary | ICD-10-CM | POA: Diagnosis not present

## 2022-09-23 DIAGNOSIS — F3132 Bipolar disorder, current episode depressed, moderate: Secondary | ICD-10-CM | POA: Diagnosis not present

## 2022-09-23 DIAGNOSIS — F209 Schizophrenia, unspecified: Secondary | ICD-10-CM | POA: Diagnosis not present

## 2022-09-23 DIAGNOSIS — F4310 Post-traumatic stress disorder, unspecified: Secondary | ICD-10-CM | POA: Diagnosis not present

## 2022-10-14 DIAGNOSIS — F3132 Bipolar disorder, current episode depressed, moderate: Secondary | ICD-10-CM | POA: Diagnosis not present

## 2022-10-14 DIAGNOSIS — F4310 Post-traumatic stress disorder, unspecified: Secondary | ICD-10-CM | POA: Diagnosis not present

## 2022-10-14 DIAGNOSIS — F209 Schizophrenia, unspecified: Secondary | ICD-10-CM | POA: Diagnosis not present

## 2022-11-12 DIAGNOSIS — F4310 Post-traumatic stress disorder, unspecified: Secondary | ICD-10-CM | POA: Diagnosis not present

## 2022-11-12 DIAGNOSIS — F209 Schizophrenia, unspecified: Secondary | ICD-10-CM | POA: Diagnosis not present

## 2022-11-12 DIAGNOSIS — F3132 Bipolar disorder, current episode depressed, moderate: Secondary | ICD-10-CM | POA: Diagnosis not present

## 2022-11-28 DIAGNOSIS — M791 Myalgia, unspecified site: Secondary | ICD-10-CM | POA: Diagnosis not present

## 2022-11-28 DIAGNOSIS — Z20822 Contact with and (suspected) exposure to covid-19: Secondary | ICD-10-CM | POA: Diagnosis not present

## 2022-12-02 DIAGNOSIS — F3132 Bipolar disorder, current episode depressed, moderate: Secondary | ICD-10-CM | POA: Diagnosis not present

## 2022-12-02 DIAGNOSIS — F209 Schizophrenia, unspecified: Secondary | ICD-10-CM | POA: Diagnosis not present

## 2022-12-02 DIAGNOSIS — F4310 Post-traumatic stress disorder, unspecified: Secondary | ICD-10-CM | POA: Diagnosis not present

## 2022-12-10 DIAGNOSIS — F209 Schizophrenia, unspecified: Secondary | ICD-10-CM | POA: Diagnosis not present

## 2022-12-10 DIAGNOSIS — F3132 Bipolar disorder, current episode depressed, moderate: Secondary | ICD-10-CM | POA: Diagnosis not present

## 2022-12-10 DIAGNOSIS — F4310 Post-traumatic stress disorder, unspecified: Secondary | ICD-10-CM | POA: Diagnosis not present

## 2023-01-13 DIAGNOSIS — F4312 Post-traumatic stress disorder, chronic: Secondary | ICD-10-CM | POA: Diagnosis not present

## 2023-01-13 DIAGNOSIS — F209 Schizophrenia, unspecified: Secondary | ICD-10-CM | POA: Diagnosis not present

## 2023-01-13 DIAGNOSIS — F3132 Bipolar disorder, current episode depressed, moderate: Secondary | ICD-10-CM | POA: Diagnosis not present

## 2023-01-22 DIAGNOSIS — F3132 Bipolar disorder, current episode depressed, moderate: Secondary | ICD-10-CM | POA: Diagnosis not present

## 2023-01-22 DIAGNOSIS — F4312 Post-traumatic stress disorder, chronic: Secondary | ICD-10-CM | POA: Diagnosis not present

## 2023-01-22 DIAGNOSIS — F209 Schizophrenia, unspecified: Secondary | ICD-10-CM | POA: Diagnosis not present

## 2023-01-27 DIAGNOSIS — F209 Schizophrenia, unspecified: Secondary | ICD-10-CM | POA: Diagnosis not present

## 2023-01-27 DIAGNOSIS — F3132 Bipolar disorder, current episode depressed, moderate: Secondary | ICD-10-CM | POA: Diagnosis not present

## 2023-01-27 DIAGNOSIS — F4312 Post-traumatic stress disorder, chronic: Secondary | ICD-10-CM | POA: Diagnosis not present

## 2023-02-05 DIAGNOSIS — F209 Schizophrenia, unspecified: Secondary | ICD-10-CM | POA: Diagnosis not present

## 2023-02-05 DIAGNOSIS — F4312 Post-traumatic stress disorder, chronic: Secondary | ICD-10-CM | POA: Diagnosis not present

## 2023-02-05 DIAGNOSIS — F3132 Bipolar disorder, current episode depressed, moderate: Secondary | ICD-10-CM | POA: Diagnosis not present

## 2023-02-10 DIAGNOSIS — F3132 Bipolar disorder, current episode depressed, moderate: Secondary | ICD-10-CM | POA: Diagnosis not present

## 2023-02-10 DIAGNOSIS — F4312 Post-traumatic stress disorder, chronic: Secondary | ICD-10-CM | POA: Diagnosis not present

## 2023-02-10 DIAGNOSIS — F209 Schizophrenia, unspecified: Secondary | ICD-10-CM | POA: Diagnosis not present

## 2023-02-19 DIAGNOSIS — F209 Schizophrenia, unspecified: Secondary | ICD-10-CM | POA: Diagnosis not present

## 2023-02-19 DIAGNOSIS — F3132 Bipolar disorder, current episode depressed, moderate: Secondary | ICD-10-CM | POA: Diagnosis not present

## 2023-02-19 DIAGNOSIS — F4312 Post-traumatic stress disorder, chronic: Secondary | ICD-10-CM | POA: Diagnosis not present

## 2023-05-07 DIAGNOSIS — F209 Schizophrenia, unspecified: Secondary | ICD-10-CM | POA: Diagnosis not present

## 2023-05-07 DIAGNOSIS — F4310 Post-traumatic stress disorder, unspecified: Secondary | ICD-10-CM | POA: Diagnosis not present

## 2023-05-07 DIAGNOSIS — F3132 Bipolar disorder, current episode depressed, moderate: Secondary | ICD-10-CM | POA: Diagnosis not present

## 2023-05-14 DIAGNOSIS — F3132 Bipolar disorder, current episode depressed, moderate: Secondary | ICD-10-CM | POA: Diagnosis not present

## 2023-05-14 DIAGNOSIS — F209 Schizophrenia, unspecified: Secondary | ICD-10-CM | POA: Diagnosis not present

## 2023-05-14 DIAGNOSIS — F4310 Post-traumatic stress disorder, unspecified: Secondary | ICD-10-CM | POA: Diagnosis not present

## 2023-05-21 DIAGNOSIS — F4310 Post-traumatic stress disorder, unspecified: Secondary | ICD-10-CM | POA: Diagnosis not present

## 2023-05-21 DIAGNOSIS — F209 Schizophrenia, unspecified: Secondary | ICD-10-CM | POA: Diagnosis not present

## 2023-05-21 DIAGNOSIS — F3132 Bipolar disorder, current episode depressed, moderate: Secondary | ICD-10-CM | POA: Diagnosis not present

## 2023-06-04 DIAGNOSIS — F3132 Bipolar disorder, current episode depressed, moderate: Secondary | ICD-10-CM | POA: Diagnosis not present

## 2023-06-04 DIAGNOSIS — F4310 Post-traumatic stress disorder, unspecified: Secondary | ICD-10-CM | POA: Diagnosis not present

## 2023-06-04 DIAGNOSIS — F209 Schizophrenia, unspecified: Secondary | ICD-10-CM | POA: Diagnosis not present

## 2023-07-02 DIAGNOSIS — F209 Schizophrenia, unspecified: Secondary | ICD-10-CM | POA: Diagnosis not present

## 2023-07-02 DIAGNOSIS — F3132 Bipolar disorder, current episode depressed, moderate: Secondary | ICD-10-CM | POA: Diagnosis not present

## 2023-07-02 DIAGNOSIS — F4310 Post-traumatic stress disorder, unspecified: Secondary | ICD-10-CM | POA: Diagnosis not present

## 2023-08-06 DIAGNOSIS — F209 Schizophrenia, unspecified: Secondary | ICD-10-CM | POA: Diagnosis not present

## 2023-08-06 DIAGNOSIS — F3132 Bipolar disorder, current episode depressed, moderate: Secondary | ICD-10-CM | POA: Diagnosis not present

## 2023-08-06 DIAGNOSIS — F4310 Post-traumatic stress disorder, unspecified: Secondary | ICD-10-CM | POA: Diagnosis not present

## 2023-08-18 DIAGNOSIS — F3132 Bipolar disorder, current episode depressed, moderate: Secondary | ICD-10-CM | POA: Diagnosis not present

## 2023-08-18 DIAGNOSIS — F209 Schizophrenia, unspecified: Secondary | ICD-10-CM | POA: Diagnosis not present

## 2023-08-18 DIAGNOSIS — F4310 Post-traumatic stress disorder, unspecified: Secondary | ICD-10-CM | POA: Diagnosis not present

## 2023-08-27 DIAGNOSIS — F209 Schizophrenia, unspecified: Secondary | ICD-10-CM | POA: Diagnosis not present

## 2023-08-27 DIAGNOSIS — F4310 Post-traumatic stress disorder, unspecified: Secondary | ICD-10-CM | POA: Diagnosis not present

## 2023-08-27 DIAGNOSIS — F3132 Bipolar disorder, current episode depressed, moderate: Secondary | ICD-10-CM | POA: Diagnosis not present

## 2023-09-15 DIAGNOSIS — F209 Schizophrenia, unspecified: Secondary | ICD-10-CM | POA: Diagnosis not present

## 2023-09-15 DIAGNOSIS — F4310 Post-traumatic stress disorder, unspecified: Secondary | ICD-10-CM | POA: Diagnosis not present

## 2023-09-15 DIAGNOSIS — F3132 Bipolar disorder, current episode depressed, moderate: Secondary | ICD-10-CM | POA: Diagnosis not present

## 2023-10-14 DIAGNOSIS — F209 Schizophrenia, unspecified: Secondary | ICD-10-CM | POA: Diagnosis not present

## 2023-10-14 DIAGNOSIS — F4310 Post-traumatic stress disorder, unspecified: Secondary | ICD-10-CM | POA: Diagnosis not present

## 2023-10-14 DIAGNOSIS — F3132 Bipolar disorder, current episode depressed, moderate: Secondary | ICD-10-CM | POA: Diagnosis not present

## 2023-11-05 DIAGNOSIS — F209 Schizophrenia, unspecified: Secondary | ICD-10-CM | POA: Diagnosis not present

## 2023-11-05 DIAGNOSIS — F4310 Post-traumatic stress disorder, unspecified: Secondary | ICD-10-CM | POA: Diagnosis not present

## 2023-11-05 DIAGNOSIS — F3132 Bipolar disorder, current episode depressed, moderate: Secondary | ICD-10-CM | POA: Diagnosis not present

## 2023-11-11 DIAGNOSIS — F3132 Bipolar disorder, current episode depressed, moderate: Secondary | ICD-10-CM | POA: Diagnosis not present

## 2023-11-11 DIAGNOSIS — F4310 Post-traumatic stress disorder, unspecified: Secondary | ICD-10-CM | POA: Diagnosis not present

## 2023-11-11 DIAGNOSIS — F209 Schizophrenia, unspecified: Secondary | ICD-10-CM | POA: Diagnosis not present

## 2023-11-18 DIAGNOSIS — F209 Schizophrenia, unspecified: Secondary | ICD-10-CM | POA: Diagnosis not present

## 2023-11-18 DIAGNOSIS — F4310 Post-traumatic stress disorder, unspecified: Secondary | ICD-10-CM | POA: Diagnosis not present

## 2023-11-18 DIAGNOSIS — F3132 Bipolar disorder, current episode depressed, moderate: Secondary | ICD-10-CM | POA: Diagnosis not present

## 2023-11-26 DIAGNOSIS — F209 Schizophrenia, unspecified: Secondary | ICD-10-CM | POA: Diagnosis not present

## 2023-11-26 DIAGNOSIS — F4310 Post-traumatic stress disorder, unspecified: Secondary | ICD-10-CM | POA: Diagnosis not present

## 2023-11-26 DIAGNOSIS — F3132 Bipolar disorder, current episode depressed, moderate: Secondary | ICD-10-CM | POA: Diagnosis not present

## 2023-12-03 DIAGNOSIS — F3132 Bipolar disorder, current episode depressed, moderate: Secondary | ICD-10-CM | POA: Diagnosis not present

## 2023-12-03 DIAGNOSIS — F4310 Post-traumatic stress disorder, unspecified: Secondary | ICD-10-CM | POA: Diagnosis not present

## 2023-12-03 DIAGNOSIS — F209 Schizophrenia, unspecified: Secondary | ICD-10-CM | POA: Diagnosis not present

## 2023-12-10 DIAGNOSIS — F3132 Bipolar disorder, current episode depressed, moderate: Secondary | ICD-10-CM | POA: Diagnosis not present

## 2023-12-10 DIAGNOSIS — F4310 Post-traumatic stress disorder, unspecified: Secondary | ICD-10-CM | POA: Diagnosis not present

## 2023-12-10 DIAGNOSIS — F209 Schizophrenia, unspecified: Secondary | ICD-10-CM | POA: Diagnosis not present

## 2023-12-11 DIAGNOSIS — S40021A Contusion of right upper arm, initial encounter: Secondary | ICD-10-CM | POA: Diagnosis not present

## 2023-12-15 DIAGNOSIS — F209 Schizophrenia, unspecified: Secondary | ICD-10-CM | POA: Diagnosis not present

## 2023-12-15 DIAGNOSIS — F3132 Bipolar disorder, current episode depressed, moderate: Secondary | ICD-10-CM | POA: Diagnosis not present

## 2023-12-15 DIAGNOSIS — F4310 Post-traumatic stress disorder, unspecified: Secondary | ICD-10-CM | POA: Diagnosis not present

## 2023-12-31 DIAGNOSIS — F209 Schizophrenia, unspecified: Secondary | ICD-10-CM | POA: Diagnosis not present

## 2023-12-31 DIAGNOSIS — F3132 Bipolar disorder, current episode depressed, moderate: Secondary | ICD-10-CM | POA: Diagnosis not present

## 2023-12-31 DIAGNOSIS — F4310 Post-traumatic stress disorder, unspecified: Secondary | ICD-10-CM | POA: Diagnosis not present

## 2024-01-07 DIAGNOSIS — F4310 Post-traumatic stress disorder, unspecified: Secondary | ICD-10-CM | POA: Diagnosis not present

## 2024-01-07 DIAGNOSIS — F209 Schizophrenia, unspecified: Secondary | ICD-10-CM | POA: Diagnosis not present

## 2024-01-07 DIAGNOSIS — F3132 Bipolar disorder, current episode depressed, moderate: Secondary | ICD-10-CM | POA: Diagnosis not present

## 2024-01-14 DIAGNOSIS — F209 Schizophrenia, unspecified: Secondary | ICD-10-CM | POA: Diagnosis not present

## 2024-01-14 DIAGNOSIS — F3132 Bipolar disorder, current episode depressed, moderate: Secondary | ICD-10-CM | POA: Diagnosis not present

## 2024-01-14 DIAGNOSIS — F4310 Post-traumatic stress disorder, unspecified: Secondary | ICD-10-CM | POA: Diagnosis not present

## 2024-01-21 DIAGNOSIS — F3132 Bipolar disorder, current episode depressed, moderate: Secondary | ICD-10-CM | POA: Diagnosis not present

## 2024-01-21 DIAGNOSIS — F209 Schizophrenia, unspecified: Secondary | ICD-10-CM | POA: Diagnosis not present

## 2024-01-21 DIAGNOSIS — F4310 Post-traumatic stress disorder, unspecified: Secondary | ICD-10-CM | POA: Diagnosis not present

## 2024-01-28 DIAGNOSIS — F3132 Bipolar disorder, current episode depressed, moderate: Secondary | ICD-10-CM | POA: Diagnosis not present

## 2024-01-28 DIAGNOSIS — F209 Schizophrenia, unspecified: Secondary | ICD-10-CM | POA: Diagnosis not present

## 2024-01-28 DIAGNOSIS — F4310 Post-traumatic stress disorder, unspecified: Secondary | ICD-10-CM | POA: Diagnosis not present

## 2024-02-08 DIAGNOSIS — F3132 Bipolar disorder, current episode depressed, moderate: Secondary | ICD-10-CM | POA: Diagnosis not present

## 2024-02-08 DIAGNOSIS — F209 Schizophrenia, unspecified: Secondary | ICD-10-CM | POA: Diagnosis not present

## 2024-02-08 DIAGNOSIS — F908 Attention-deficit hyperactivity disorder, other type: Secondary | ICD-10-CM | POA: Diagnosis not present

## 2024-02-08 DIAGNOSIS — F4310 Post-traumatic stress disorder, unspecified: Secondary | ICD-10-CM | POA: Diagnosis not present

## 2024-02-08 DIAGNOSIS — F3489 Other specified persistent mood disorders: Secondary | ICD-10-CM | POA: Diagnosis not present

## 2024-02-08 DIAGNOSIS — F411 Generalized anxiety disorder: Secondary | ICD-10-CM | POA: Diagnosis not present

## 2024-02-18 DIAGNOSIS — F3132 Bipolar disorder, current episode depressed, moderate: Secondary | ICD-10-CM | POA: Diagnosis not present

## 2024-02-18 DIAGNOSIS — F4310 Post-traumatic stress disorder, unspecified: Secondary | ICD-10-CM | POA: Diagnosis not present

## 2024-02-18 DIAGNOSIS — F209 Schizophrenia, unspecified: Secondary | ICD-10-CM | POA: Diagnosis not present

## 2024-02-25 DIAGNOSIS — F209 Schizophrenia, unspecified: Secondary | ICD-10-CM | POA: Diagnosis not present

## 2024-02-25 DIAGNOSIS — F3132 Bipolar disorder, current episode depressed, moderate: Secondary | ICD-10-CM | POA: Diagnosis not present

## 2024-02-25 DIAGNOSIS — F4310 Post-traumatic stress disorder, unspecified: Secondary | ICD-10-CM | POA: Diagnosis not present

## 2024-03-01 DIAGNOSIS — F3132 Bipolar disorder, current episode depressed, moderate: Secondary | ICD-10-CM | POA: Diagnosis not present

## 2024-03-01 DIAGNOSIS — F209 Schizophrenia, unspecified: Secondary | ICD-10-CM | POA: Diagnosis not present

## 2024-03-01 DIAGNOSIS — F4310 Post-traumatic stress disorder, unspecified: Secondary | ICD-10-CM | POA: Diagnosis not present

## 2024-03-07 DIAGNOSIS — F411 Generalized anxiety disorder: Secondary | ICD-10-CM | POA: Diagnosis not present

## 2024-03-07 DIAGNOSIS — F908 Attention-deficit hyperactivity disorder, other type: Secondary | ICD-10-CM | POA: Diagnosis not present

## 2024-03-07 DIAGNOSIS — F3489 Other specified persistent mood disorders: Secondary | ICD-10-CM | POA: Diagnosis not present

## 2024-03-21 DIAGNOSIS — F209 Schizophrenia, unspecified: Secondary | ICD-10-CM | POA: Diagnosis not present

## 2024-03-21 DIAGNOSIS — F3132 Bipolar disorder, current episode depressed, moderate: Secondary | ICD-10-CM | POA: Diagnosis not present

## 2024-03-21 DIAGNOSIS — F4310 Post-traumatic stress disorder, unspecified: Secondary | ICD-10-CM | POA: Diagnosis not present

## 2024-03-24 DIAGNOSIS — F3132 Bipolar disorder, current episode depressed, moderate: Secondary | ICD-10-CM | POA: Diagnosis not present

## 2024-03-24 DIAGNOSIS — F4310 Post-traumatic stress disorder, unspecified: Secondary | ICD-10-CM | POA: Diagnosis not present

## 2024-03-24 DIAGNOSIS — F209 Schizophrenia, unspecified: Secondary | ICD-10-CM | POA: Diagnosis not present

## 2024-03-31 DIAGNOSIS — F209 Schizophrenia, unspecified: Secondary | ICD-10-CM | POA: Diagnosis not present

## 2024-03-31 DIAGNOSIS — F3132 Bipolar disorder, current episode depressed, moderate: Secondary | ICD-10-CM | POA: Diagnosis not present

## 2024-03-31 DIAGNOSIS — F4310 Post-traumatic stress disorder, unspecified: Secondary | ICD-10-CM | POA: Diagnosis not present

## 2024-04-07 DIAGNOSIS — F3132 Bipolar disorder, current episode depressed, moderate: Secondary | ICD-10-CM | POA: Diagnosis not present

## 2024-04-07 DIAGNOSIS — F4310 Post-traumatic stress disorder, unspecified: Secondary | ICD-10-CM | POA: Diagnosis not present

## 2024-04-07 DIAGNOSIS — F209 Schizophrenia, unspecified: Secondary | ICD-10-CM | POA: Diagnosis not present

## 2024-04-14 DIAGNOSIS — F209 Schizophrenia, unspecified: Secondary | ICD-10-CM | POA: Diagnosis not present

## 2024-04-14 DIAGNOSIS — F4310 Post-traumatic stress disorder, unspecified: Secondary | ICD-10-CM | POA: Diagnosis not present

## 2024-04-14 DIAGNOSIS — F3132 Bipolar disorder, current episode depressed, moderate: Secondary | ICD-10-CM | POA: Diagnosis not present

## 2024-04-18 DIAGNOSIS — G4700 Insomnia, unspecified: Secondary | ICD-10-CM | POA: Diagnosis not present

## 2024-04-18 DIAGNOSIS — F908 Attention-deficit hyperactivity disorder, other type: Secondary | ICD-10-CM | POA: Diagnosis not present

## 2024-04-18 DIAGNOSIS — F411 Generalized anxiety disorder: Secondary | ICD-10-CM | POA: Diagnosis not present

## 2024-04-18 DIAGNOSIS — F3489 Other specified persistent mood disorders: Secondary | ICD-10-CM | POA: Diagnosis not present

## 2024-04-21 DIAGNOSIS — F209 Schizophrenia, unspecified: Secondary | ICD-10-CM | POA: Diagnosis not present

## 2024-04-21 DIAGNOSIS — F3132 Bipolar disorder, current episode depressed, moderate: Secondary | ICD-10-CM | POA: Diagnosis not present

## 2024-04-21 DIAGNOSIS — F4310 Post-traumatic stress disorder, unspecified: Secondary | ICD-10-CM | POA: Diagnosis not present

## 2024-04-28 DIAGNOSIS — F209 Schizophrenia, unspecified: Secondary | ICD-10-CM | POA: Diagnosis not present

## 2024-04-28 DIAGNOSIS — F4310 Post-traumatic stress disorder, unspecified: Secondary | ICD-10-CM | POA: Diagnosis not present

## 2024-04-28 DIAGNOSIS — F3132 Bipolar disorder, current episode depressed, moderate: Secondary | ICD-10-CM | POA: Diagnosis not present

## 2024-05-08 DIAGNOSIS — F4310 Post-traumatic stress disorder, unspecified: Secondary | ICD-10-CM | POA: Diagnosis not present

## 2024-05-08 DIAGNOSIS — F3132 Bipolar disorder, current episode depressed, moderate: Secondary | ICD-10-CM | POA: Diagnosis not present

## 2024-05-08 DIAGNOSIS — F209 Schizophrenia, unspecified: Secondary | ICD-10-CM | POA: Diagnosis not present

## 2024-05-10 DIAGNOSIS — F209 Schizophrenia, unspecified: Secondary | ICD-10-CM | POA: Diagnosis not present

## 2024-05-10 DIAGNOSIS — F3132 Bipolar disorder, current episode depressed, moderate: Secondary | ICD-10-CM | POA: Diagnosis not present

## 2024-05-10 DIAGNOSIS — F4310 Post-traumatic stress disorder, unspecified: Secondary | ICD-10-CM | POA: Diagnosis not present

## 2024-05-19 DIAGNOSIS — F209 Schizophrenia, unspecified: Secondary | ICD-10-CM | POA: Diagnosis not present

## 2024-05-19 DIAGNOSIS — F4310 Post-traumatic stress disorder, unspecified: Secondary | ICD-10-CM | POA: Diagnosis not present

## 2024-05-19 DIAGNOSIS — F3132 Bipolar disorder, current episode depressed, moderate: Secondary | ICD-10-CM | POA: Diagnosis not present

## 2024-06-01 DIAGNOSIS — F209 Schizophrenia, unspecified: Secondary | ICD-10-CM | POA: Diagnosis not present

## 2024-06-01 DIAGNOSIS — F4310 Post-traumatic stress disorder, unspecified: Secondary | ICD-10-CM | POA: Diagnosis not present

## 2024-06-01 DIAGNOSIS — F3132 Bipolar disorder, current episode depressed, moderate: Secondary | ICD-10-CM | POA: Diagnosis not present

## 2024-06-09 DIAGNOSIS — F209 Schizophrenia, unspecified: Secondary | ICD-10-CM | POA: Diagnosis not present

## 2024-06-09 DIAGNOSIS — F4310 Post-traumatic stress disorder, unspecified: Secondary | ICD-10-CM | POA: Diagnosis not present

## 2024-06-09 DIAGNOSIS — F3132 Bipolar disorder, current episode depressed, moderate: Secondary | ICD-10-CM | POA: Diagnosis not present

## 2024-06-16 DIAGNOSIS — F3132 Bipolar disorder, current episode depressed, moderate: Secondary | ICD-10-CM | POA: Diagnosis not present

## 2024-06-16 DIAGNOSIS — F209 Schizophrenia, unspecified: Secondary | ICD-10-CM | POA: Diagnosis not present

## 2024-06-16 DIAGNOSIS — F4310 Post-traumatic stress disorder, unspecified: Secondary | ICD-10-CM | POA: Diagnosis not present

## 2024-06-22 DIAGNOSIS — F3132 Bipolar disorder, current episode depressed, moderate: Secondary | ICD-10-CM | POA: Diagnosis not present

## 2024-06-22 DIAGNOSIS — F209 Schizophrenia, unspecified: Secondary | ICD-10-CM | POA: Diagnosis not present

## 2024-06-22 DIAGNOSIS — F4310 Post-traumatic stress disorder, unspecified: Secondary | ICD-10-CM | POA: Diagnosis not present

## 2024-06-30 DIAGNOSIS — F3132 Bipolar disorder, current episode depressed, moderate: Secondary | ICD-10-CM | POA: Diagnosis not present

## 2024-06-30 DIAGNOSIS — F209 Schizophrenia, unspecified: Secondary | ICD-10-CM | POA: Diagnosis not present

## 2024-06-30 DIAGNOSIS — F4310 Post-traumatic stress disorder, unspecified: Secondary | ICD-10-CM | POA: Diagnosis not present

## 2024-07-07 DIAGNOSIS — F3132 Bipolar disorder, current episode depressed, moderate: Secondary | ICD-10-CM | POA: Diagnosis not present

## 2024-07-07 DIAGNOSIS — F4310 Post-traumatic stress disorder, unspecified: Secondary | ICD-10-CM | POA: Diagnosis not present

## 2024-07-07 DIAGNOSIS — F209 Schizophrenia, unspecified: Secondary | ICD-10-CM | POA: Diagnosis not present

## 2024-07-21 DIAGNOSIS — F209 Schizophrenia, unspecified: Secondary | ICD-10-CM | POA: Diagnosis not present

## 2024-07-21 DIAGNOSIS — F4310 Post-traumatic stress disorder, unspecified: Secondary | ICD-10-CM | POA: Diagnosis not present

## 2024-07-21 DIAGNOSIS — F3132 Bipolar disorder, current episode depressed, moderate: Secondary | ICD-10-CM | POA: Diagnosis not present

## 2024-07-28 DIAGNOSIS — F4310 Post-traumatic stress disorder, unspecified: Secondary | ICD-10-CM | POA: Diagnosis not present

## 2024-07-28 DIAGNOSIS — F3132 Bipolar disorder, current episode depressed, moderate: Secondary | ICD-10-CM | POA: Diagnosis not present

## 2024-07-28 DIAGNOSIS — F209 Schizophrenia, unspecified: Secondary | ICD-10-CM | POA: Diagnosis not present

## 2024-08-04 DIAGNOSIS — F4310 Post-traumatic stress disorder, unspecified: Secondary | ICD-10-CM | POA: Diagnosis not present

## 2024-08-04 DIAGNOSIS — F3132 Bipolar disorder, current episode depressed, moderate: Secondary | ICD-10-CM | POA: Diagnosis not present

## 2024-08-04 DIAGNOSIS — F209 Schizophrenia, unspecified: Secondary | ICD-10-CM | POA: Diagnosis not present

## 2024-08-08 DIAGNOSIS — F908 Attention-deficit hyperactivity disorder, other type: Secondary | ICD-10-CM | POA: Diagnosis not present

## 2024-08-08 DIAGNOSIS — G4700 Insomnia, unspecified: Secondary | ICD-10-CM | POA: Diagnosis not present

## 2024-08-08 DIAGNOSIS — F411 Generalized anxiety disorder: Secondary | ICD-10-CM | POA: Diagnosis not present

## 2024-08-08 DIAGNOSIS — F3489 Other specified persistent mood disorders: Secondary | ICD-10-CM | POA: Diagnosis not present

## 2024-08-11 DIAGNOSIS — F4310 Post-traumatic stress disorder, unspecified: Secondary | ICD-10-CM | POA: Diagnosis not present

## 2024-08-11 DIAGNOSIS — F209 Schizophrenia, unspecified: Secondary | ICD-10-CM | POA: Diagnosis not present

## 2024-08-11 DIAGNOSIS — F3132 Bipolar disorder, current episode depressed, moderate: Secondary | ICD-10-CM | POA: Diagnosis not present

## 2024-08-25 DIAGNOSIS — F3132 Bipolar disorder, current episode depressed, moderate: Secondary | ICD-10-CM | POA: Diagnosis not present

## 2024-08-25 DIAGNOSIS — F4310 Post-traumatic stress disorder, unspecified: Secondary | ICD-10-CM | POA: Diagnosis not present

## 2024-08-25 DIAGNOSIS — F209 Schizophrenia, unspecified: Secondary | ICD-10-CM | POA: Diagnosis not present

## 2024-09-05 DIAGNOSIS — F3489 Other specified persistent mood disorders: Secondary | ICD-10-CM | POA: Diagnosis not present

## 2024-09-05 DIAGNOSIS — F908 Attention-deficit hyperactivity disorder, other type: Secondary | ICD-10-CM | POA: Diagnosis not present

## 2024-09-05 DIAGNOSIS — F411 Generalized anxiety disorder: Secondary | ICD-10-CM | POA: Diagnosis not present

## 2024-09-05 DIAGNOSIS — G4700 Insomnia, unspecified: Secondary | ICD-10-CM | POA: Diagnosis not present

## 2024-09-08 DIAGNOSIS — F3132 Bipolar disorder, current episode depressed, moderate: Secondary | ICD-10-CM | POA: Diagnosis not present

## 2024-09-08 DIAGNOSIS — F209 Schizophrenia, unspecified: Secondary | ICD-10-CM | POA: Diagnosis not present

## 2024-09-08 DIAGNOSIS — F4310 Post-traumatic stress disorder, unspecified: Secondary | ICD-10-CM | POA: Diagnosis not present

## 2024-09-22 DIAGNOSIS — F209 Schizophrenia, unspecified: Secondary | ICD-10-CM | POA: Diagnosis not present

## 2024-09-22 DIAGNOSIS — F4310 Post-traumatic stress disorder, unspecified: Secondary | ICD-10-CM | POA: Diagnosis not present

## 2024-09-22 DIAGNOSIS — F3132 Bipolar disorder, current episode depressed, moderate: Secondary | ICD-10-CM | POA: Diagnosis not present

## 2024-09-29 DIAGNOSIS — F209 Schizophrenia, unspecified: Secondary | ICD-10-CM | POA: Diagnosis not present

## 2024-09-29 DIAGNOSIS — F3132 Bipolar disorder, current episode depressed, moderate: Secondary | ICD-10-CM | POA: Diagnosis not present

## 2024-09-29 DIAGNOSIS — F4310 Post-traumatic stress disorder, unspecified: Secondary | ICD-10-CM | POA: Diagnosis not present

## 2024-10-13 DIAGNOSIS — F209 Schizophrenia, unspecified: Secondary | ICD-10-CM | POA: Diagnosis not present

## 2024-10-13 DIAGNOSIS — F3132 Bipolar disorder, current episode depressed, moderate: Secondary | ICD-10-CM | POA: Diagnosis not present

## 2024-10-13 DIAGNOSIS — F4310 Post-traumatic stress disorder, unspecified: Secondary | ICD-10-CM | POA: Diagnosis not present

## 2024-10-20 DIAGNOSIS — F4310 Post-traumatic stress disorder, unspecified: Secondary | ICD-10-CM | POA: Diagnosis not present

## 2024-10-20 DIAGNOSIS — F209 Schizophrenia, unspecified: Secondary | ICD-10-CM | POA: Diagnosis not present

## 2024-10-20 DIAGNOSIS — F3132 Bipolar disorder, current episode depressed, moderate: Secondary | ICD-10-CM | POA: Diagnosis not present

## 2024-10-27 DIAGNOSIS — F4310 Post-traumatic stress disorder, unspecified: Secondary | ICD-10-CM | POA: Diagnosis not present

## 2024-10-27 DIAGNOSIS — F3132 Bipolar disorder, current episode depressed, moderate: Secondary | ICD-10-CM | POA: Diagnosis not present

## 2024-10-27 DIAGNOSIS — F209 Schizophrenia, unspecified: Secondary | ICD-10-CM | POA: Diagnosis not present

## 2024-11-03 DIAGNOSIS — F209 Schizophrenia, unspecified: Secondary | ICD-10-CM | POA: Diagnosis not present

## 2024-11-03 DIAGNOSIS — F3132 Bipolar disorder, current episode depressed, moderate: Secondary | ICD-10-CM | POA: Diagnosis not present

## 2024-11-03 DIAGNOSIS — F4310 Post-traumatic stress disorder, unspecified: Secondary | ICD-10-CM | POA: Diagnosis not present

## 2024-11-24 DIAGNOSIS — F4310 Post-traumatic stress disorder, unspecified: Secondary | ICD-10-CM | POA: Diagnosis not present

## 2024-11-24 DIAGNOSIS — F209 Schizophrenia, unspecified: Secondary | ICD-10-CM | POA: Diagnosis not present

## 2024-11-24 DIAGNOSIS — F3132 Bipolar disorder, current episode depressed, moderate: Secondary | ICD-10-CM | POA: Diagnosis not present

## 2024-11-28 DIAGNOSIS — F908 Attention-deficit hyperactivity disorder, other type: Secondary | ICD-10-CM | POA: Diagnosis not present

## 2024-11-28 DIAGNOSIS — G4700 Insomnia, unspecified: Secondary | ICD-10-CM | POA: Diagnosis not present

## 2024-11-28 DIAGNOSIS — F3489 Other specified persistent mood disorders: Secondary | ICD-10-CM | POA: Diagnosis not present

## 2024-11-28 DIAGNOSIS — F411 Generalized anxiety disorder: Secondary | ICD-10-CM | POA: Diagnosis not present

## 2024-12-08 DIAGNOSIS — F4310 Post-traumatic stress disorder, unspecified: Secondary | ICD-10-CM | POA: Diagnosis not present

## 2024-12-08 DIAGNOSIS — F3132 Bipolar disorder, current episode depressed, moderate: Secondary | ICD-10-CM | POA: Diagnosis not present

## 2024-12-08 DIAGNOSIS — F209 Schizophrenia, unspecified: Secondary | ICD-10-CM | POA: Diagnosis not present

## 2024-12-13 DIAGNOSIS — F4310 Post-traumatic stress disorder, unspecified: Secondary | ICD-10-CM | POA: Diagnosis not present

## 2024-12-13 DIAGNOSIS — F209 Schizophrenia, unspecified: Secondary | ICD-10-CM | POA: Diagnosis not present

## 2024-12-13 DIAGNOSIS — F3132 Bipolar disorder, current episode depressed, moderate: Secondary | ICD-10-CM | POA: Diagnosis not present
